# Patient Record
Sex: Female | Born: 1995 | Race: Black or African American | Hispanic: No | Marital: Single | Smoking: Never smoker
Health system: Southern US, Community
[De-identification: ages and names within clinical notes are randomized; demographics above are authoritative.]

## PROBLEM LIST (undated history)

## (undated) ENCOUNTER — Inpatient Hospital Stay (HOSPITAL_COMMUNITY): Payer: Self-pay

## (undated) DIAGNOSIS — F419 Anxiety disorder, unspecified: Secondary | ICD-10-CM

## (undated) DIAGNOSIS — Z789 Other specified health status: Secondary | ICD-10-CM

## (undated) HISTORY — PX: NO PAST SURGERIES: SHX2092

---
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
## 2013-03-20 ENCOUNTER — Other Ambulatory Visit: Payer: Self-pay | Admitting: Family Medicine

## 2013-04-01 ENCOUNTER — Ambulatory Visit
Admission: RE | Admit: 2013-04-01 | Discharge: 2013-04-01 | Disposition: A | Discharge status: Home / Self Care | Payer: Federal, State, Local not specified - PPO | Source: Ambulatory Visit | Attending: Family Medicine | Admitting: Family Medicine

## 2013-04-01 MED ORDER — GADOBENATE DIMEGLUMINE 529 MG/ML IV SOLN
10.0000 mL | Freq: Once | INTRAVENOUS | Status: AC | PRN
  Administered 2013-04-01: 10 mL via INTRAVENOUS

## 2013-10-21 ENCOUNTER — Emergency Department (HOSPITAL_COMMUNITY)
Admission: EM | Admit: 2013-10-21 | Discharge: 2013-10-21 | Disposition: A | Discharge status: Home / Self Care | Payer: Federal, State, Local not specified - PPO | Attending: Emergency Medicine | Admitting: Emergency Medicine

## 2013-10-21 ENCOUNTER — Encounter (HOSPITAL_COMMUNITY): Payer: Self-pay | Admitting: Emergency Medicine

## 2013-10-21 DIAGNOSIS — Z3202 Encounter for pregnancy test, result negative: Secondary | ICD-10-CM | POA: Insufficient documentation

## 2013-10-21 DIAGNOSIS — R109 Unspecified abdominal pain: Secondary | ICD-10-CM

## 2013-10-21 DIAGNOSIS — Z79899 Other long term (current) drug therapy: Secondary | ICD-10-CM | POA: Insufficient documentation

## 2013-10-21 DIAGNOSIS — R1013 Epigastric pain: Secondary | ICD-10-CM | POA: Insufficient documentation

## 2013-10-21 DIAGNOSIS — R112 Nausea with vomiting, unspecified: Secondary | ICD-10-CM | POA: Insufficient documentation

## 2013-10-21 DIAGNOSIS — R197 Diarrhea, unspecified: Secondary | ICD-10-CM | POA: Insufficient documentation

## 2013-10-21 LAB — CBC WITH DIFFERENTIAL/PLATELET
BASOS ABS: 0 10*3/uL (ref 0.0–0.1)
BASOS PCT: 0 % (ref 0–1)
EOS ABS: 0.2 10*3/uL (ref 0.0–0.7)
EOS PCT: 2 % (ref 0–5)
HEMATOCRIT: 36.5 % (ref 36.0–46.0)
HEMOGLOBIN: 12.4 g/dL (ref 12.0–15.0)
Lymphocytes Relative: 24 % (ref 12–46)
Lymphs Abs: 2 10*3/uL (ref 0.7–4.0)
MCH: 29.8 pg (ref 26.0–34.0)
MCHC: 34 g/dL (ref 30.0–36.0)
MCV: 87.7 fL (ref 78.0–100.0)
MONO ABS: 0.4 10*3/uL (ref 0.1–1.0)
Monocytes Relative: 5 % (ref 3–12)
NEUTROS PCT: 69 % (ref 43–77)
Neutro Abs: 5.7 10*3/uL (ref 1.7–7.7)
Platelets: 194 10*3/uL (ref 150–400)
RBC: 4.16 MIL/uL (ref 3.87–5.11)
RDW: 12.1 % (ref 11.5–15.5)
WBC: 8.3 10*3/uL (ref 4.0–10.5)

## 2013-10-21 LAB — URINALYSIS, ROUTINE W REFLEX MICROSCOPIC
BILIRUBIN URINE: NEGATIVE
GLUCOSE, UA: NEGATIVE mg/dL
HGB URINE DIPSTICK: NEGATIVE
KETONES UR: NEGATIVE mg/dL
LEUKOCYTES UA: NEGATIVE
Nitrite: NEGATIVE
Protein, ur: NEGATIVE mg/dL
Specific Gravity, Urine: 1.022 (ref 1.005–1.030)
UROBILINOGEN UA: 1 mg/dL (ref 0.0–1.0)
pH: 7 (ref 5.0–8.0)

## 2013-10-21 LAB — COMPREHENSIVE METABOLIC PANEL
ALBUMIN: 4.1 g/dL (ref 3.5–5.2)
ALK PHOS: 54 U/L (ref 39–117)
ALT: 11 U/L (ref 0–35)
AST: 17 U/L (ref 0–37)
BUN: 9 mg/dL (ref 6–23)
CALCIUM: 9.1 mg/dL (ref 8.4–10.5)
CHLORIDE: 106 meq/L (ref 96–112)
CO2: 24 mEq/L (ref 19–32)
Creatinine, Ser: 0.7 mg/dL (ref 0.50–1.10)
GLUCOSE: 103 mg/dL — AB (ref 70–99)
POTASSIUM: 3.9 meq/L (ref 3.7–5.3)
Sodium: 140 mEq/L (ref 137–147)
TOTAL PROTEIN: 7.1 g/dL (ref 6.0–8.3)
Total Bilirubin: 0.3 mg/dL (ref 0.3–1.2)

## 2013-10-21 LAB — LIPASE, BLOOD: Lipase: 23 U/L (ref 11–59)

## 2013-10-21 LAB — POC URINE PREG, ED: Preg Test, Ur: NEGATIVE

## 2013-10-21 MED ORDER — ONDANSETRON HCL 4 MG PO TABS: 4.0000 mg | ORAL_TABLET | Freq: Three times a day (TID) | ORAL | Status: DC | PRN

## 2013-10-21 MED ORDER — DICYCLOMINE HCL 10 MG PO CAPS
10.0000 mg | ORAL_CAPSULE | Freq: Once | ORAL | Status: AC
  Administered 2013-10-21: 10 mg via ORAL
  Filled 2013-10-21: qty 1

## 2013-10-21 NOTE — ED Notes (Signed)
Pt states she started having abd pain, vomiting and diarrhea this morning about 0230. Denies any urinary symptoms.

## 2013-10-21 NOTE — ED Notes (Signed)
Patient tolerated oral challenge without incident.  

## 2013-10-21 NOTE — Discharge Instructions (Signed)
Do not hesitate to return to the Emergency Department for any new, worsening or concerning symptoms.  ° °If you do not have a primary care doctor you can establish one at the  ° °[facility]: °[address] °[place] [address] °[phone] ° °After you establish care. Let them know you were seen in the emergency room. They must obtain records for further management.  ° °Abdominal Pain, Women °Abdominal (stomach, pelvic, or belly) pain can be caused by many things. It is important to tell your doctor: °· The location of the pain. °· Does it come and go or is it present all the time? °· Are there things that start the pain (eating certain foods, exercise)? °· Are there other symptoms associated with the pain (fever, nausea, vomiting, diarrhea)? °All of this is helpful to know when trying to find the cause of the pain. °CAUSES  °· Stomach: virus or bacteria infection, or ulcer. °· Intestine: appendicitis (inflamed appendix), regional ileitis (Crohn's disease), ulcerative colitis (inflamed colon), irritable bowel syndrome, diverticulitis (inflamed diverticulum of the colon), or cancer of the stomach or intestine. °· Gallbladder disease or stones in the gallbladder. °· Kidney disease, kidney stones, or infection. °· Pancreas infection or cancer. °· Fibromyalgia (pain disorder). °· Diseases of the female organs: °· Uterus: fibroid (non-cancerous) tumors or infection. °· Fallopian tubes: infection or tubal pregnancy. °· Ovary: cysts or tumors. °· Pelvic adhesions (scar tissue). °· Endometriosis (uterus lining tissue growing in the pelvis and on the pelvic organs). °· Pelvic congestion syndrome (female organs filling up with blood just before the menstrual period). °· Pain with the menstrual period. °· Pain with ovulation (producing an egg). °· Pain with an IUD (intrauterine device, birth control) in the uterus. °· Cancer of the female organs. °· Functional pain (pain not caused by a disease, may  improve without treatment). °· Psychological pain. °· Depression. °DIAGNOSIS  °Your doctor will decide the seriousness of your pain by doing an examination. °· Blood tests. °· X-rays. °· Ultrasound. °· CT scan (computed tomography, special type of X-ray). °· MRI (magnetic resonance imaging). °· Cultures, for infection. °· Barium enema (dye inserted in the large intestine, to better view it with X-rays). °· Colonoscopy (looking in intestine with a lighted tube). °· Laparoscopy (minor surgery, looking in abdomen with a lighted tube). °· Major abdominal exploratory surgery (looking in abdomen with a large incision). °TREATMENT  °The treatment will depend on the cause of the pain.  °· Many cases can be observed and treated at home. °· Over-the-counter medicines recommended by your caregiver. °· Prescription medicine. °· Antibiotics, for infection. °· Birth control pills, for painful periods or for ovulation pain. °· Hormone treatment, for endometriosis. °· Nerve blocking injections. °· Physical therapy. °· Antidepressants. °· Counseling with a psychologist or psychiatrist. °· Minor or major surgery. °HOME CARE INSTRUCTIONS  °· Do not take laxatives, unless directed by your caregiver. °· Take over-the-counter pain medicine only if ordered by your caregiver. Do not take aspirin because it can cause an upset stomach or bleeding. °· Try a clear liquid diet (broth or water) as ordered by your caregiver. Slowly move to a bland diet, as tolerated, if the pain is related to the stomach or intestine. °· Have a thermometer and take your temperature several times a day, and record it. °· Bed rest and sleep, if it helps the pain. °· Avoid sexual intercourse, if it causes pain. °· Avoid stressful situations. °· Keep your follow-up appointments and tests, as   as your caregiver orders.  If the pain does not go away with medicine or surgery, you may try:  Acupuncture.  Relaxation exercises (yoga, meditation).  Group  therapy.  Counseling. SEEK MEDICAL CARE IF:   You notice certain foods cause stomach pain.  Your home care treatment is not helping your pain.  You need stronger pain medicine.  You want your IUD removed.  You feel faint or lightheaded.  You develop nausea and vomiting.  You develop a rash.  You are having side effects or an allergy to your medicine. SEEK IMMEDIATE MEDICAL CARE IF:   Your pain does not go away or gets worse.  You have a fever.  Your pain is felt only in portions of the abdomen. The right side could possibly be appendicitis. The left lower portion of the abdomen could be colitis or diverticulitis.  You are passing blood in your stools (bright red or black tarry stools, with or without vomiting).  You have blood in your urine.  You develop chills, with or without a fever.  You pass out. MAKE SURE YOU:   Understand these instructions.  Will watch your condition.  Will get help right away if you are not doing well or get worse. Document Released: 03/13/2007 Document Revised: 08/08/2011 Document Reviewed: 04/02/2009 [facility] Patient Information 2014 [place], [place].

## 2013-10-21 NOTE — ED Provider Notes (Signed)
CSN: [id number]     Arrival date & time 10/21/13  0534 History   First MD Initiated Contact with Patient 10/21/13 0703     Chief Complaint  Patient presents with  . Abdominal Pain     (Consider location/radiation/quality/duration/timing/severity/associated sxs/prior Treatment) HPI  Kathleen Durham is a 18 y.o. female complaining acute onset of severe epigastric pain at 2:30 AM which woke her from sleep. Pain is 10 out of 10, but after the pain came she started vomiting and having diarrhea. This lasted for 3 hours and spontaneously resolved. Patient states that pain is minimal she is to sore right now. Patient denies sick contacts, excessive alcohol use, fever chills, abnormal vaginal discharge, dysuria, urinary frequency.   History reviewed. No pertinent past medical history. History reviewed. No pertinent past surgical history. No family history on file. History  Substance Use Topics  . Smoking status: Never Smoker   . Smokeless tobacco: Not on file  . Alcohol Use: No   OB History   Grav Para Term Preterm Abortions TAB SAB Ect Mult Living                 Review of Systems  10 systems reviewed and found to be negative, except as noted in the HPI.   Allergies  Review of patient's allergies indicates no known allergies.  Home Medications   Prior to Admission medications   Medication Sig Start Date End Date Taking? Authorizing Provider  citalopram (CELEXA) 10 MG tablet Take 10 mg by mouth daily.   Yes Historical Provider, MD   BP 101/55  Pulse 81  Temp(Src) 98.8 F (37.1 C) (Oral)  Resp 18  Ht 5\' 1"  (1.549 m)  Wt 100 lb (45.36 kg)  BMI 18.90 kg/m2  SpO2 100%  LMP 10/18/2013 Physical Exam  Nursing note and vitals reviewed. Constitutional: She is oriented to person, place, and time. She appears well-developed and well-nourished. No distress.  HENT:  Head: Normocephalic and atraumatic.  Mouth/Throat: Oropharynx is clear and moist.  Eyes: Conjunctivae and EOM are  normal. Pupils are equal, round, and reactive to light.  Neck: Normal range of motion.  Cardiovascular: Normal rate, regular rhythm and intact distal pulses.   Pulmonary/Chest: Effort normal and breath sounds normal. No stridor. No respiratory distress. She has no wheezes. She has no rales. She exhibits no tenderness.  Abdominal: Soft. Bowel sounds are normal. She exhibits no distension and no mass. There is no tenderness. There is no rebound and no guarding.  Musculoskeletal: Normal range of motion.  Neurological: She is alert and oriented to person, place, and time.  Skin: Skin is warm.  Psychiatric: She has a normal mood and affect.    ED Course  Procedures (including critical care time) Labs Review Labs Reviewed  COMPREHENSIVE METABOLIC PANEL - Abnormal; Notable for the following:    Glucose, Bld 103 (*)    All other components within normal limits  CBC WITH DIFFERENTIAL  LIPASE, BLOOD  URINALYSIS, ROUTINE W REFLEX MICROSCOPIC  POC URINE PREG, ED    Imaging Review No results found.   EKG Interpretation None      MDM   Final diagnoses:  Abdominal pain  Nausea vomiting and diarrhea    Filed Vitals:   10/21/13 0554 10/21/13 0725  BP: 110/72 101/55  Pulse: 83 81  Temp: 98.8 F (37.1 C)   TempSrc: Oral   Resp: 20 18  Height: 5\' 1"  (1.549 m)   Weight: 100 lb (45.36 kg)   SpO2:  99% 100%    Medications  dicyclomine (BENTYL) capsule 10 mg (10 mg Oral Given 10/21/13 0743)    Kathleen Durham is a 18 y.o. female presenting with severe epigastric pain which woke her from sleep at 2:30 AM. It is associated with multiple episodes of nonbloody, nonbilious, coffee-ground emesis and diarrhea. All symptoms are now currently resolved. Abdominal exam is completely benign. Blood work reassuring. Plan is to by mouth challenge and reassess. Serial abdominal exams remained benign, patient with no pain, nausea or persistent vomiting. She is passed by mouth challenge, is amenable to  discharge. A sensation of discussion of return precautions patient verbalized. Seems reliable for followup. I advised her that we'll write her for Zofran but if she does vomit again we need to see her back in the ED.  Evaluation does not show pathology that would require ongoing emergent intervention or inpatient treatment. Pt is hemodynamically stable and mentating appropriately. Discussed findings and plan with patient/guardian, who agrees with care plan. All questions answered. Return precautions discussed and outpatient follow up given.   New Prescriptions   ONDANSETRON (ZOFRAN) 4 MG TABLET    Take 1 tablet (4 mg total) by mouth every 8 (eight) hours as needed for nausea or vomiting.    Note: Portions of this report may have been transcribed using voice recognition software. Every effort was made to ensure accuracy; however, inadvertent computerized transcription errors may be present     Kathleen Emeryicole Lavaya Defreitas, PA-C 10/21/13 [phone]

## 2013-10-21 NOTE — ED Notes (Signed)
Pt states no abnormal bleeding but does have some discharge

## 2013-10-24 NOTE — ED Provider Notes (Signed)
Medical screening examination/treatment/procedure(s) were performed by non-physician practitioner and as supervising physician I was immediately available for consultation/collaboration.    Emmerson Shuffield L Orah Sonnen, MD 10/24/13 0709 

## 2014-02-12 ENCOUNTER — Emergency Department (HOSPITAL_COMMUNITY)
Admission: EM | Admit: 2014-02-12 | Discharge: 2014-02-12 | Disposition: A | Discharge status: Home / Self Care | Payer: Federal, State, Local not specified - PPO | Attending: Emergency Medicine | Admitting: Emergency Medicine

## 2014-02-12 ENCOUNTER — Encounter (HOSPITAL_COMMUNITY): Payer: Self-pay | Admitting: Emergency Medicine

## 2014-02-12 DIAGNOSIS — G44019 Episodic cluster headache, not intractable: Secondary | ICD-10-CM

## 2014-02-12 DIAGNOSIS — H5710 Ocular pain, unspecified eye: Secondary | ICD-10-CM | POA: Insufficient documentation

## 2014-02-12 DIAGNOSIS — H5789 Other specified disorders of eye and adnexa: Secondary | ICD-10-CM | POA: Insufficient documentation

## 2014-02-12 DIAGNOSIS — G44009 Cluster headache syndrome, unspecified, not intractable: Secondary | ICD-10-CM | POA: Diagnosis not present

## 2014-02-12 MED ORDER — TETRACAINE HCL 0.5 % OP SOLN
1.0000 [drp] | Freq: Once | OPHTHALMIC | Status: AC
  Administered 2014-02-12: 1 [drp] via OPHTHALMIC
  Filled 2014-02-12: qty 2

## 2014-02-12 MED ORDER — KETOROLAC TROMETHAMINE 60 MG/2ML IM SOLN
30.0000 mg | Freq: Once | INTRAMUSCULAR | Status: AC
  Administered 2014-02-12: 30 mg via INTRAMUSCULAR
  Filled 2014-02-12: qty 2

## 2014-02-12 MED ORDER — FLUORESCEIN SODIUM 1 MG OP STRP
1.0000 | ORAL_STRIP | Freq: Once | OPHTHALMIC | Status: AC
  Administered 2014-02-12: 2 via OPHTHALMIC
  Filled 2014-02-12: qty 1

## 2014-02-12 MED ORDER — ONDANSETRON 4 MG PO TBDP
4.0000 mg | ORAL_TABLET | Freq: Once | ORAL | Status: AC
  Administered 2014-02-12: 4 mg via ORAL
  Filled 2014-02-12: qty 1

## 2014-02-12 NOTE — Discharge Instructions (Signed)
Cluster Headache Cluster headaches are recognized by their pattern of deep, intense head pain. They normally occur on one side of your head, but they may "switch sides" in subsequent episodes. Typically, cluster headaches:   Are severe in nature.   Occur repeatedly over weeks to months and are followed by periods of no headaches.   Can last from 15 minutes to 3 hours.   Occur at the same time each day, often at night.   Occur several times a day. CAUSES The exact cause of cluster headaches is not known. Alcohol use may be associated with cluster headaches. SIGNS AND SYMPTOMS   Severe pain that begins in or around your eye or temple.   One-sided head pain.   Feeling sick to your stomach (nauseous).   Sensitivity to light.   Runny nose.   Eye redness, tearing, and nasal stuffiness on the side of your head where you are experiencing pain.   Sweaty, pale skin of the face.   Droopy or swollen eyelid.   Restlessness. DIAGNOSIS  Cluster headaches are diagnosed based on symptoms and a physical exam. Your health care provider may order a CT scan or an MRI of your head or lab tests to see if your headaches are caused by other medical conditions.  TREATMENT   Medicines for pain relief and to prevent recurrent attacks. Some people may need a combination of medicines.  Oxygen for pain relief.   Biofeedback programs to help reduce headache pain.  It may be helpful to keep a headache diary. This may help you find a trend for what is triggering your headaches. Your health care provider can develop a treatment plan.  HOME CARE INSTRUCTIONS  During cluster periods:   Follow a regular sleep schedule. Do not vary the amount and time that you sleep from day to day. It is important to stay on the same schedule during a cluster period to help prevent headaches.   Avoid alcohol.   Stop smoking if you smoke.  SEEK MEDICAL CARE IF:  You have any changes from your previous  cluster headaches either in intensity or frequency.   You are not getting relief from medicines you are taking.  SEEK IMMEDIATE MEDICAL CARE IF:   You faint.   You have weakness or numbness, especially on one side of your body or face.   You have double vision.   You have nausea or vomiting that is not relieved within several hours.   You cannot keep your balance or have difficulty talking or walking.   You have neck pain or stiffness.   You have a fever. MAKE SURE YOU:  Understand these instructions.   Will watch your condition.   Will get help right away if you are not doing well or get worse. Document Released: 05/16/2005 Document Revised: 03/06/2013 Document Reviewed: 12/06/2012 [employer] Patient Information 2015 [employer]. This information is not intended to replace advice given to you by your health care provider. Make sure you discuss any questions you have with your health care provider.  

## 2014-02-12 NOTE — ED Provider Notes (Addendum)
CSN: [id number]     Arrival date & time 02/12/14  0913 History   First MD Initiated Contact with Patient 02/12/14 0935     Chief Complaint  Patient presents with  . Nausea  . Emesis  . Eye Pain     (Consider location/radiation/quality/duration/timing/severity/associated sxs/prior Treatment) HPI  Patient presents with left eye pain and headache. Does report history of  Migraines which are usually temporal. Patient reports that she slept her contacts last night about her left eye pain was related to this. She denies any neck pain or fevers. She did not take anything for her pain this morning. Currently her pain is 9/10. She reports associated nausea and vomiting which she's had in the past with her headaches. Feels her vision is blurry in the left eye; however, patient has removed her left contact and states that she has "really bad vision to begin with." She denies any weakness, numbness, tingling.  Does report excessive tearing from the left eye.  Didn't take anything for the pain this morning.  History reviewed. No pertinent past medical history. History reviewed. No pertinent past surgical history. No family history on file. History  Substance Use Topics  . Smoking status: Never Smoker   . Smokeless tobacco: Not on file  . Alcohol Use: No   OB History   Grav Para Term Preterm Abortions TAB SAB Ect Mult Living                 Review of Systems  Constitutional: Negative for fever.  Eyes: Positive for photophobia, pain and visual disturbance. Negative for discharge, redness and itching.  Respiratory: Negative for chest tightness and shortness of breath.   Cardiovascular: Negative for chest pain.  Gastrointestinal: Positive for nausea and vomiting. Negative for abdominal pain and diarrhea.  Genitourinary: Negative for dysuria.  Musculoskeletal: Negative for neck pain.  Neurological: Positive for headaches. Negative for dizziness, weakness and light-headedness.   Psychiatric/Behavioral: Negative for confusion.  All other systems reviewed and are negative.     Allergies  Review of patient's allergies indicates no known allergies.  Home Medications   Prior to Admission medications   Not on File   BP 108/64  Pulse 77  Temp(Src) 98.6 F (37 C) (Oral)  Resp 16  Ht  (1.549 m)  Wt 107 lb 9 oz (48.79 kg)  BMI 20.33 kg/m2  SpO2 100%  LMP 02/05/2014 Physical Exam  Nursing note and vitals reviewed. Constitutional: She is oriented to person, place, and time. She appears well-developed and well-nourished. No distress.  HENT:  Head: Normocephalic and atraumatic.  Mouth/Throat: Oropharynx is clear and moist.  Eyes: Conjunctivae and EOM are normal. Pupils are equal, round, and reactive to light.  Pupils 5 mm reactive bilaterally, vision documented 20/70 right eye, 20/800 left eye, fluoroscein exam without evidence of corneal abrasion or corneal ulcer, increased tearing, pressure 1 mmHg Left eye/3 mmHg Right eye  Neck: Neck supple.  Cardiovascular: Normal rate, regular rhythm and normal heart sounds.   Pulmonary/Chest: Effort normal and breath sounds normal. No respiratory distress. She has no wheezes.  Neurological: She is alert and oriented to person, place, and time. No cranial nerve deficit.  5 out of 5 strength in all 4 extremities  Skin: Skin is warm and dry.  Psychiatric: She has a normal mood and affect.    ED Course  Procedures (including critical care time) Labs Review Labs Reviewed - No data to display  Imaging Review No results found.   EKG  Interpretation None      MDM   Final diagnoses:  Episodic cluster headache, not intractable    Patient presents with headache and left eye pain. She is nontoxic and nonfocal on exam. Eye exam is unremarkable showing no evidence of corneal abrasion or corneal ulcer. She does have decreased visual acuity be reports poor vision in that eye without correction. She has a history of  migraines; however, given excessive tearing in the eye, this may be more consistent with a cluster headache. Patient was given Toradol and Zofran.  Patient reports improvement of symptoms with treatment. Discussed the patient suspicion for cluster headache. Patient instructed to take ibuprofen at home as needed and was given education regarding cluster headaches. Patient also encouraged not to sleep in her contacts.  After history, exam, and medical workup I feel the patient has been appropriately medically screened and is safe for discharge home. Pertinent diagnoses were discussed with the patient. Patient was given return precautions.    Shon Baton, MD 02/12/14 1157  Shon Baton, MD 02/12/14 [phone]

## 2014-02-12 NOTE — ED Notes (Addendum)
Visual Acuity                      Right Eye 20/70 with a contact in.  Left Eye 20/800  Both 20/70

## 2014-02-12 NOTE — ED Notes (Signed)
Pt states L sided eye pain, onset this morning, also reports headache and nausea/vomiting x1 month. 8/10 pain at the time. Pt is alert and oriented x4. No neurological deficits noted.

## 2014-02-12 NOTE — ED Notes (Addendum)
Pt states she woke this morning with L eye pain.  Pt reports that the pain is so bad she has had N/V.  Pt also reports she has a migraine, but does not appear to have any problems texting on her phone.

## 2015-01-16 ENCOUNTER — Emergency Department (HOSPITAL_COMMUNITY)
Admission: EM | Admit: 2015-01-16 | Discharge: 2015-01-16 | Disposition: A | Discharge status: Home / Self Care | Payer: Federal, State, Local not specified - PPO | Attending: Emergency Medicine | Admitting: Emergency Medicine

## 2015-01-16 ENCOUNTER — Emergency Department (HOSPITAL_COMMUNITY): Payer: Federal, State, Local not specified - PPO

## 2015-01-16 ENCOUNTER — Encounter (HOSPITAL_COMMUNITY): Payer: Self-pay | Admitting: General Practice

## 2015-01-16 DIAGNOSIS — K5900 Constipation, unspecified: Secondary | ICD-10-CM | POA: Insufficient documentation

## 2015-01-16 DIAGNOSIS — N898 Other specified noninflammatory disorders of vagina: Secondary | ICD-10-CM

## 2015-01-16 DIAGNOSIS — R102 Pelvic and perineal pain: Secondary | ICD-10-CM | POA: Diagnosis not present

## 2015-01-16 DIAGNOSIS — R1030 Lower abdominal pain, unspecified: Secondary | ICD-10-CM | POA: Diagnosis present

## 2015-01-16 DIAGNOSIS — R110 Nausea: Secondary | ICD-10-CM | POA: Diagnosis not present

## 2015-01-16 DIAGNOSIS — O26899 Other specified pregnancy related conditions, unspecified trimester: Secondary | ICD-10-CM

## 2015-01-16 DIAGNOSIS — Z331 Pregnant state, incidental: Secondary | ICD-10-CM | POA: Insufficient documentation

## 2015-01-16 DIAGNOSIS — R109 Unspecified abdominal pain: Secondary | ICD-10-CM

## 2015-01-16 DIAGNOSIS — Z3490 Encounter for supervision of normal pregnancy, unspecified, unspecified trimester: Secondary | ICD-10-CM

## 2015-01-16 LAB — CBC WITH DIFFERENTIAL/PLATELET
BASOS ABS: 0 10*3/uL (ref 0.0–0.1)
BASOS PCT: 0 % (ref 0–1)
EOS ABS: 0.2 10*3/uL (ref 0.0–0.7)
EOS PCT: 2 % (ref 0–5)
HCT: 37.5 % (ref 36.0–46.0)
Hemoglobin: 12.9 g/dL (ref 12.0–15.0)
Lymphocytes Relative: 19 % (ref 12–46)
Lymphs Abs: 2.2 10*3/uL (ref 0.7–4.0)
MCH: 29.7 pg (ref 26.0–34.0)
MCHC: 34.4 g/dL (ref 30.0–36.0)
MCV: 86.2 fL (ref 78.0–100.0)
MONOS PCT: 5 % (ref 3–12)
Monocytes Absolute: 0.5 10*3/uL (ref 0.1–1.0)
Neutro Abs: 8.2 10*3/uL — ABNORMAL HIGH (ref 1.7–7.7)
Neutrophils Relative %: 74 % (ref 43–77)
PLATELETS: 223 10*3/uL (ref 150–400)
RBC: 4.35 MIL/uL (ref 3.87–5.11)
RDW: 11.7 % (ref 11.5–15.5)
WBC: 11.1 10*3/uL — ABNORMAL HIGH (ref 4.0–10.5)

## 2015-01-16 LAB — COMPREHENSIVE METABOLIC PANEL
ALT: 10 U/L — ABNORMAL LOW (ref 14–54)
AST: 21 U/L (ref 15–41)
Albumin: 4.1 g/dL (ref 3.5–5.0)
Alkaline Phosphatase: 52 U/L (ref 38–126)
Anion gap: 10 (ref 5–15)
BUN: <5 mg/dL — ABNORMAL LOW (ref 6–20)
CHLORIDE: 102 mmol/L (ref 101–111)
CO2: 25 mmol/L (ref 22–32)
Calcium: 9.2 mg/dL (ref 8.9–10.3)
Creatinine, Ser: 0.71 mg/dL (ref 0.44–1.00)
GFR calc Af Amer: >60 mL/min (ref >60)
GFR calc non Af Amer: >60 mL/min (ref >60)
GLUCOSE: 82 mg/dL (ref 65–99)
POTASSIUM: 3.9 mmol/L (ref 3.5–5.1)
Sodium: 137 mmol/L (ref 135–145)
Total Bilirubin: 0.8 mg/dL (ref 0.3–1.2)
Total Protein: 6.8 g/dL (ref 6.5–8.1)

## 2015-01-16 LAB — URINE MICROSCOPIC-ADD ON

## 2015-01-16 LAB — WET PREP, GENITAL
TRICH WET PREP: NONE SEEN
Yeast Wet Prep HPF POC: NONE SEEN

## 2015-01-16 LAB — URINALYSIS, ROUTINE W REFLEX MICROSCOPIC
Bilirubin Urine: NEGATIVE
GLUCOSE, UA: NEGATIVE mg/dL
HGB URINE DIPSTICK: NEGATIVE
Nitrite: NEGATIVE
Protein, ur: NEGATIVE mg/dL
Specific Gravity, Urine: 1.029 (ref 1.005–1.030)
Urobilinogen, UA: 1 mg/dL (ref 0.0–1.0)
pH: 7 (ref 5.0–8.0)

## 2015-01-16 LAB — POC URINE PREG, ED: PREG TEST UR: POSITIVE — AB

## 2015-01-16 LAB — LIPASE, BLOOD: Lipase: 15 U/L — ABNORMAL LOW (ref 22–51)

## 2015-01-16 LAB — ABO/RH: ABO/RH(D): O POS

## 2015-01-16 LAB — SEDIMENTATION RATE: Sed Rate: 4 mm/hr (ref 0–22)

## 2015-01-16 LAB — HCG, QUANTITATIVE, PREGNANCY: HCG, BETA CHAIN, QUANT, S: 26875 m[IU]/mL — AB (ref <5)

## 2015-01-16 MED ORDER — SODIUM CHLORIDE 0.9 % IV BOLUS (SEPSIS)
1000.0000 mL | Freq: Once | INTRAVENOUS | Status: AC
  Administered 2015-01-16: 1000 mL via INTRAVENOUS

## 2015-01-16 MED ORDER — PRENATAL COMPLETE 14-0.4 MG PO TABS: 2.0000 | ORAL_TABLET | Freq: Every day | ORAL | Status: DC

## 2015-01-16 NOTE — ED Provider Notes (Signed)
CSN: [id number]     Arrival date & time 01/16/15  1014 History   First MD Initiated Contact with Patient 01/16/15 1023     Chief Complaint  Patient presents with  . Abdominal Pain     (Consider location/radiation/quality/duration/timing/severity/associated sxs/prior Treatment) Patient is a 19 y.o. female presenting with abdominal pain. The history is provided by the patient and medical records. No language interpreter was used.  Abdominal Pain Associated symptoms: nausea and vaginal discharge   Associated symptoms: no chest pain, no constipation, no cough, no diarrhea, no dysuria, no fatigue, no fever, no hematuria, no shortness of breath and no vomiting      Kathleen Durham is a 19 y.o. female  with no major medical hx presents to the Emergency Department complaining of gradual, intermittent, progressively worsening lower abd pain onset 1.5 weeks ago.  Pt describes the pain as cramping rated at an 8/10.  She reports it lasts 30sec-1 min and happens several times per day.  Nothing makes it better.  Walking for a long time makes it worse. Associated symptoms include nausea without vomiting. Pt denies fever, chills, headache, neck pain, chest pain, SOB, vomiting, diarrhea, weakness, syncope, dysuria.  Pt reports she is some constipated. She reports hard stool and straining with small movements.  She reports last BM this AM without melena or hematochezia.  Pt reports vaginal discharge that is increased in amount and began to be malodorous in the last 24 hours.  Pt reports she is sexually active with 1 female partner.  Pt denies Hx of STD.  LMP: 12/10/2014 .  Pt is not on birth control.  Pt reports social EtOH usage, denies drug usage. Pt reports she has not been smoking for several months.     History reviewed. No pertinent past medical history. History reviewed. No pertinent past surgical history. No family history on file. Social History  Substance Use Topics  . Smoking status: Never Smoker   .  Smokeless tobacco: None  . Alcohol Use: No   OB History    No data available     Review of Systems  Constitutional: Negative for fever, diaphoresis, appetite change, fatigue and unexpected weight change.  HENT: Negative for mouth sores.   Eyes: Negative for visual disturbance.  Respiratory: Negative for cough, chest tightness, shortness of breath and wheezing.   Cardiovascular: Negative for chest pain.  Gastrointestinal: Positive for nausea and abdominal pain. Negative for vomiting, diarrhea and constipation.  Endocrine: Negative for polydipsia, polyphagia and polyuria.  Genitourinary: Positive for vaginal discharge. Negative for dysuria, urgency, frequency and hematuria.  Musculoskeletal: Negative for back pain and neck stiffness.  Skin: Negative for rash.  Allergic/Immunologic: Negative for immunocompromised state.  Neurological: Negative for syncope, light-headedness and headaches.  Hematological: Does not bruise/bleed easily.  Psychiatric/Behavioral: Negative for sleep disturbance. The patient is not nervous/anxious.       Allergies  Review of patient's allergies indicates no known allergies.  Home Medications   Prior to Admission medications   Medication Sig Start Date End Date Taking? Authorizing Provider  Prenatal Vit-Fe Fumarate-FA (PRENATAL COMPLETE) 14-0.4 MG TABS Take 2 tablets by mouth daily. 01/16/15   Jaaziah Schulke, PA-C   BP 99/64 mmHg  Pulse 82  SpO2 100%  LMP 12/13/2014 (Exact Date) Physical Exam  Constitutional: She appears well-developed and well-nourished. No distress.  Awake, alert, nontoxic appearance  HENT:  Head: Normocephalic and atraumatic.  Mouth/Throat: Oropharynx is clear and moist. No oropharyngeal exudate.  Eyes: Conjunctivae are normal. No scleral  icterus.  Neck: Normal range of motion. Neck supple.  Cardiovascular: Normal rate, regular rhythm, normal heart sounds and intact distal pulses.   No murmur heard. Pulmonary/Chest: Effort  normal and breath sounds normal. No respiratory distress. She has no wheezes.  Equal chest expansion  Abdominal: Soft. Bowel sounds are normal. She exhibits no distension and no mass. There is no tenderness. There is no rebound and no guarding. Hernia confirmed negative in the right inguinal area and confirmed negative in the left inguinal area.  Genitourinary: Uterus normal. No labial fusion. There is no rash, tenderness or lesion on the right labia. There is no rash, tenderness or lesion on the left labia. Uterus is not deviated, not enlarged, not fixed and not tender. Cervix exhibits no motion tenderness, no discharge and no friability. Right adnexum displays tenderness. Right adnexum displays no mass and no fullness. Left adnexum displays no mass, no tenderness and no fullness. No erythema, tenderness or bleeding in the vagina. No foreign body around the vagina. No signs of injury around the vagina. Vaginal discharge (Leukorrhea) found.  Musculoskeletal: Normal range of motion. She exhibits no edema.  Lymphadenopathy:       Right: No inguinal adenopathy present.       Left: No inguinal adenopathy present.  Neurological: She is alert.  Speech is clear and goal oriented Moves extremities without ataxia  Skin: Skin is warm and dry. She is not diaphoretic. No erythema.  Psychiatric: She has a normal mood and affect.  Nursing note and vitals reviewed.   ED Course  Procedures (including critical care time) Labs Review Labs Reviewed  WET PREP, GENITAL - Abnormal; Notable for the following:    Clue Cells Wet Prep HPF POC FEW (*)    WBC, Wet Prep HPF POC TOO NUMEROUS TO COUNT (*)    All other components within normal limits  URINALYSIS, ROUTINE W REFLEX MICROSCOPIC (NOT AT [facility]) - Abnormal; Notable for the following:    Color, Urine AMBER (*)    APPearance CLOUDY (*)    Ketones, ur >80 (*)    Leukocytes, UA SMALL (*)    All other components within normal limits  CBC WITH  DIFFERENTIAL/PLATELET - Abnormal; Notable for the following:    WBC 11.1 (*)    Neutro Abs 8.2 (*)    All other components within normal limits  COMPREHENSIVE METABOLIC PANEL - Abnormal; Notable for the following:    BUN <5 (*)    ALT 10 (*)    All other components within normal limits  LIPASE, BLOOD - Abnormal; Notable for the following:    Lipase 15 (*)    All other components within normal limits  HCG, QUANTITATIVE, PREGNANCY - Abnormal; Notable for the following:    hCG, Beta Chain, Kathleen Durham, S [id number] (*)    All other components within normal limits  URINE MICROSCOPIC-ADD ON - Abnormal; Notable for the following:    Squamous Epithelial / LPF FEW (*)    Bacteria, UA FEW (*)    All other components within normal limits  POC URINE PREG, ED - Abnormal; Notable for the following:    Preg Test, Ur POSITIVE (*)    All other components within normal limits  SEDIMENTATION RATE  RPR  HIV ANTIBODY (ROUTINE TESTING)  ABO/RH  GC/CHLAMYDIA PROBE AMP ([place]) NOT AT [facility]    Imaging Review US Ob Comp Less 14 Wks  01/16/2015   CLINICAL DATA:  Lower abdominal pain. Gestational age by LMP of 5 weeks 4  days.  EXAM: OBSTETRIC <14 WK [place] AND TRANSVAGINAL OB US  TECHNIQUE: Both transabdominal and transvaginal ultrasound examinations were performed for complete evaluation of the gestation as well as the maternal uterus, adnexal regions, and pelvic cul-de-sac. Transvaginal technique was performed to assess early pregnancy.  COMPARISON:  None.  FINDINGS: Intrauterine gestational sac: Visualized/normal in shape.  Yolk sac:  Visualized  Embryo:  Visualized  Cardiac Activity: Visualized  Heart Rate: 109  bpm  CRL:  4  mm   6 w   1 d                  [place] EDC: 09/10/2015  Maternal uterus/adnexae: Retroverted uterus. Both ovaries are normal in appearance. No adnexal mass visualized. Tiny amount of simple free fluid in cul-de-sac.  IMPRESSION: Single living IUP measuring 6 weeks 1 day, which is concordant with LMP.  No adnexal mass identified.   Electronically Signed   By: Myles Rosenthal M.D.   On: 01/16/2015 13:28   US Ob Transvaginal  01/16/2015   CLINICAL DATA:  Lower abdominal pain. Gestational age by LMP of 5 weeks 4 days.  EXAM: OBSTETRIC <14 WK [place] AND TRANSVAGINAL OB US  TECHNIQUE: Both transabdominal and transvaginal ultrasound examinations were performed for complete evaluation of the gestation as well as the maternal uterus, adnexal regions, and pelvic cul-de-sac. Transvaginal technique was performed to assess early pregnancy.  COMPARISON:  None.  FINDINGS: Intrauterine gestational sac: Visualized/normal in shape.  Yolk sac:  Visualized  Embryo:  Visualized  Cardiac Activity: Visualized  Heart Rate: 109  bpm  CRL:  4  mm   6 w   1 d                  [place] EDC: 09/10/2015  Maternal uterus/adnexae: Retroverted uterus. Both ovaries are normal in appearance. No adnexal mass visualized. Tiny amount of simple free fluid in cul-de-sac.  IMPRESSION: Single living IUP measuring 6 weeks 1 day, which is concordant with LMP. No adnexal mass identified.   Electronically Signed   By: Myles Rosenthal M.D.   On: 01/16/2015 13:28   I have personally reviewed and evaluated these images and lab results as part of my medical decision-making.   EKG Interpretation None      MDM   Final diagnoses:  Abdominal pain  Pelvic pain during pregnancy  Pregnancy  Vaginal discharge   Kathleen Durham presents with intermittent sharp, lower abd pain for the last 1.5 weeks.  Leukorrhea on exam.  No CMT.  Mild R adnexal tenderness without mass.  Pt is resting comfortably without abd pain at this time.    12:06 PM Pregnancy test positive.  Will obtain HGC quant and [place].  Mild leukocytosis and TNTC WBCs on wet prep.  UA without evidence of UTI.  Pt is receiving her fluids at this time.    Orthostatic VS for the past 24 hrs:  BP- Lying Pulse- Lying BP- Sitting Pulse- Sitting BP- Standing at 0 minutes Pulse- Standing at 0 minutes  01/16/15 1212  109/60 mmHg 79 115/63 mmHg 88 101/63 mmHg 85    1:45PM Patient without orthostatic hypotension. HCG Quant greater than 26,000.  Ultrasound with IUP at approximately 6 weeks.  Patient's cramping likely due to lack of water intake and pregnancy. Doubt heterotopic pregnancy.  Abdomen soft and nontender. Discussed strict return precautions for the patient including vaginal bleeding, fevers or abdominal pain that is not intermittent. Recommend close follow-up with OB/GYN. Patient discharged home with  prenatal vitamins.  BP 99/64 mmHg  Pulse 82  SpO2 100%  LMP 12/13/2014 (Exact Date)   Dierdre Forth, PA-C 01/16/15 1416  Raeford Razor, MD 01/21/15 [phone]

## 2015-01-16 NOTE — ED Notes (Addendum)
Pt complaining of abdominal pain that started a week and a half ago. Last night pt started feeling nauseated. Pt rating pain a 8/10. Pt describes the pain as a cramping that is intermittent. Pt declines any chills. Pt denies vomiting.

## 2015-01-16 NOTE — Discharge Instructions (Signed)
1. Medications: prenatal vitamins, usual home medications 2. Treatment: rest, drink plenty of fluids,  3. Follow Up: Please followup with OB/GYN for discussion of your diagnoses and further evaluation after today's visit; if you do not have a primary care doctor use the resource guide provided to find one; Please return to the ER for worsening symptoms, vaginal bleeding or other concerns    Abdominal Pain During Pregnancy Abdominal pain is common in pregnancy. Most of the time, it does not cause harm. There are many causes of abdominal pain. Some causes are more serious than others. Some of the causes of abdominal pain in pregnancy are easily diagnosed. Occasionally, the diagnosis takes time to understand. Other times, the cause is not determined. Abdominal pain can be a sign that something is very wrong with the pregnancy, or the pain may have nothing to do with the pregnancy at all. For this reason, always tell your health care provider if you have any abdominal discomfort. HOME CARE INSTRUCTIONS  Monitor your abdominal pain for any changes. The following actions may help to alleviate any discomfort you are experiencing:  Do not have sexual intercourse or put anything in your vagina until your symptoms go away completely.  Get plenty of rest until your pain improves.  Drink clear fluids if you feel nauseous. Avoid solid food as long as you are uncomfortable or nauseous.  Only take over-the-counter or prescription medicine as directed by your health care provider.  Keep all follow-up appointments with your health care provider. SEEK IMMEDIATE MEDICAL CARE IF:  You are bleeding, leaking fluid, or passing tissue from the vagina.  You have increasing pain or cramping.  You have persistent vomiting.  You have painful or bloody urination.  You have a fever.  You notice a decrease in your baby's movements.  You have extreme weakness or feel faint.  You have shortness of breath, with or  without abdominal pain.  You develop a severe headache with abdominal pain.  You have abnormal vaginal discharge with abdominal pain.  You have persistent diarrhea.  You have abdominal pain that continues even after rest, or gets worse. MAKE SURE YOU:   Understand these instructions.  Will watch your condition.  Will get help right away if you are not doing well or get worse. Document Released: 05/16/2005 Document Revised: 03/06/2013 Document Reviewed: 12/13/2012 [facility] Patient Information 2015 [place], [place]. This information is not intended to replace advice given to you by your health care provider. Make sure you discuss any questions you have with your health care provider.

## 2015-01-17 LAB — RPR: RPR Ser Ql: NONREACTIVE

## 2015-01-17 LAB — HIV ANTIBODY (ROUTINE TESTING W REFLEX): HIV SCREEN 4TH GENERATION: NONREACTIVE

## 2015-01-19 LAB — GC/CHLAMYDIA PROBE AMP (~~LOC~~) NOT AT ARMC
CHLAMYDIA, DNA PROBE: NEGATIVE
NEISSERIA GONORRHEA: NEGATIVE

## 2015-01-20 ENCOUNTER — Encounter (HOSPITAL_COMMUNITY): Payer: Self-pay | Admitting: Emergency Medicine

## 2015-01-20 ENCOUNTER — Emergency Department (HOSPITAL_COMMUNITY)
Admission: EM | Admit: 2015-01-20 | Discharge: 2015-01-20 | Disposition: A | Discharge status: Home / Self Care | Payer: Federal, State, Local not specified - PPO | Attending: Emergency Medicine | Admitting: Emergency Medicine

## 2015-01-20 DIAGNOSIS — Z3A01 Less than 8 weeks gestation of pregnancy: Secondary | ICD-10-CM | POA: Diagnosis not present

## 2015-01-20 DIAGNOSIS — R1030 Lower abdominal pain, unspecified: Secondary | ICD-10-CM | POA: Diagnosis not present

## 2015-01-20 DIAGNOSIS — O23591 Infection of other part of genital tract in pregnancy, first trimester: Secondary | ICD-10-CM | POA: Insufficient documentation

## 2015-01-20 DIAGNOSIS — O210 Mild hyperemesis gravidarum: Secondary | ICD-10-CM | POA: Diagnosis not present

## 2015-01-20 DIAGNOSIS — O9989 Other specified diseases and conditions complicating pregnancy, childbirth and the puerperium: Secondary | ICD-10-CM | POA: Diagnosis present

## 2015-01-20 DIAGNOSIS — Z79899 Other long term (current) drug therapy: Secondary | ICD-10-CM | POA: Diagnosis not present

## 2015-01-20 DIAGNOSIS — B9689 Other specified bacterial agents as the cause of diseases classified elsewhere: Secondary | ICD-10-CM

## 2015-01-20 DIAGNOSIS — O2341 Unspecified infection of urinary tract in pregnancy, first trimester: Secondary | ICD-10-CM | POA: Diagnosis not present

## 2015-01-20 DIAGNOSIS — N760 Acute vaginitis: Secondary | ICD-10-CM

## 2015-01-20 DIAGNOSIS — Z3490 Encounter for supervision of normal pregnancy, unspecified, unspecified trimester: Secondary | ICD-10-CM

## 2015-01-20 LAB — URINE MICROSCOPIC-ADD ON

## 2015-01-20 LAB — URINALYSIS, ROUTINE W REFLEX MICROSCOPIC
Bilirubin Urine: NEGATIVE
Glucose, UA: NEGATIVE mg/dL
Hgb urine dipstick: NEGATIVE
NITRITE: NEGATIVE
PROTEIN: 30 mg/dL — AB
Specific Gravity, Urine: 1.025 (ref 1.005–1.030)
Urobilinogen, UA: 1 mg/dL (ref 0.0–1.0)
pH: 6.5 (ref 5.0–8.0)

## 2015-01-20 LAB — WET PREP, GENITAL
Trich, Wet Prep: NONE SEEN
Yeast Wet Prep HPF POC: NONE SEEN

## 2015-01-20 MED ORDER — METRONIDAZOLE 500 MG PO TABS: 500.0000 mg | ORAL_TABLET | Freq: Two times a day (BID) | ORAL | Status: DC

## 2015-01-20 MED ORDER — ONDANSETRON 4 MG PO TBDP
4.0000 mg | ORAL_TABLET | Freq: Once | ORAL | Status: AC
  Administered 2015-01-20: 4 mg via ORAL

## 2015-01-20 MED ORDER — ONDANSETRON 4 MG PO TBDP
ORAL_TABLET | ORAL | Status: AC
  Filled 2015-01-20: qty 1

## 2015-01-20 MED ORDER — ACETAMINOPHEN 325 MG PO TABS
325.0000 mg | ORAL_TABLET | Freq: Once | ORAL | Status: AC
  Administered 2015-01-20: 325 mg via ORAL
  Filled 2015-01-20: qty 1

## 2015-01-20 MED ORDER — NITROFURANTOIN MONOHYD MACRO 100 MG PO CAPS: 100.0000 mg | ORAL_CAPSULE | Freq: Two times a day (BID) | ORAL | Status: DC

## 2015-01-20 NOTE — ED Notes (Signed)
Pt here from home with c/o nausea and abd pain  Pt does have some discharge and burning upon urination , no bleeding noted

## 2015-01-20 NOTE — ED Notes (Signed)
PA at the bedside.

## 2015-01-20 NOTE — Discharge Instructions (Signed)
Bacterial Vaginosis Bacterial vaginosis is a vaginal infection that occurs when the normal balance of bacteria in the vagina is disrupted. It results from an overgrowth of certain bacteria. This is the most common vaginal infection in women of childbearing age. Treatment is important to prevent complications, especially in pregnant women, as it can cause a premature delivery. CAUSES  Bacterial vaginosis is caused by an increase in harmful bacteria that are normally present in smaller amounts in the vagina. Several different kinds of bacteria can cause bacterial vaginosis. However, the reason that the condition develops is not fully understood. RISK FACTORS Certain activities or behaviors can put you at an increased risk of developing bacterial vaginosis, including:  Having a new sex partner or multiple sex partners.  Douching.  Using an intrauterine device (IUD) for contraception. Women do not get bacterial vaginosis from toilet seats, bedding, swimming pools, or contact with objects around them. SIGNS AND SYMPTOMS  Some women with bacterial vaginosis have no signs or symptoms. Common symptoms include:  Grey vaginal discharge.  A fishlike odor with discharge, especially after sexual intercourse.  Itching or burning of the vagina and vulva.  Burning or pain with urination. DIAGNOSIS  Your health care provider will take a medical history and examine the vagina for signs of bacterial vaginosis. A sample of vaginal fluid may be taken. Your health care provider will look at this sample under a microscope to check for bacteria and abnormal cells. A vaginal pH test may also be done.  TREATMENT  Bacterial vaginosis may be treated with antibiotic medicines. These may be given in the form of a pill or a vaginal cream. A second round of antibiotics may be prescribed if the condition comes back after treatment.  HOME CARE INSTRUCTIONS   Only take over-the-counter or prescription medicines as  directed by your health care provider.  If antibiotic medicine was prescribed, take it as directed. Make sure you finish it even if you start to feel better.  Do not have sex until treatment is completed.  Tell all sexual partners that you have a vaginal infection. They should see their health care provider and be treated if they have problems, such as a mild rash or itching.  Practice safe sex by using condoms and only having one sex partner. SEEK MEDICAL CARE IF:   Your symptoms are not improving after 3 days of treatment.  You have increased discharge or pain.  You have a fever. MAKE SURE YOU:   Understand these instructions.  Will watch your condition.  Will get help right away if you are not doing well or get worse. FOR MORE INFORMATION  Centers for Disease Control and Prevention, Division of STD Prevention: SolutionApps.[employer].za American Sexual Health Association (ASHA): www.ashastd.org  Document Released: 05/16/2005 Document Revised: 03/06/2013 Document Reviewed: 12/26/2012 [facility] Patient Information 2015 [place], [place]. This information is not intended to replace advice given to you by your health care provider. Make sure you discuss any questions you have with your health care provider.  First Trimester of Pregnancy The first trimester of pregnancy is from week 1 until the end of week 12 (months 1 through 3). During this time, your baby will begin to develop inside you. At 6-8 weeks, the eyes and face are formed, and the heartbeat can be seen on ultrasound. At the end of 12 weeks, all the baby's organs are formed. Prenatal care is all the medical care you receive before the birth of your baby. Make sure you get good prenatal  care and follow all of your doctor's instructions. HOME CARE  Medicines  Take medicine only as told by your doctor. Some medicines are safe and some are not during pregnancy.  Take your prenatal vitamins as told by your doctor.  Take medicine that  helps you poop (stool softener) as needed if your doctor says it is okay. Diet  Eat regular, healthy meals.  Your doctor will tell you the amount of weight gain that is right for you.  Avoid raw meat and uncooked cheese.  If you feel sick to your stomach (nauseous) or throw up (vomit):  Eat 4 or 5 small meals a day instead of 3 large meals.  Try eating a few soda crackers.  Drink liquids between meals instead of during meals.  If you have a hard time pooping (constipation):  Eat high-fiber foods like fresh vegetables, fruit, and whole grains.  Drink enough fluids to keep your pee (urine) clear or pale yellow. Activity and Exercise  Exercise only as told by your doctor. Stop exercising if you have cramps or pain in your lower belly (abdomen) or low back.  Try to avoid standing for long periods of time. Move your legs often if you must stand in one place for a long time.  Avoid heavy lifting.  Wear low-heeled shoes. Sit and stand up straight.  You can have sex unless your doctor tells you not to. Relief of Pain or Discomfort  Wear a good support bra if your breasts are sore.  Take warm water baths (sitz baths) to soothe pain or discomfort caused by hemorrhoids. Use hemorrhoid cream if your doctor says it is okay.  Rest with your legs raised if you have leg cramps or low back pain.  Wear support hose if you have puffy, bulging veins (varicose veins) in your legs. Raise (elevate) your feet for 15 minutes, 3-4 times a day. Limit salt in your diet. Prenatal Care  Schedule your prenatal visits by the twelfth week of pregnancy.  Write down your questions. Take them to your prenatal visits.  Keep all your prenatal visits as told by your doctor. Safety  Wear your seat belt at all times when driving.  Make a list of emergency phone numbers. The list should include numbers for family, friends, the hospital, and police and fire departments. General Tips  Ask your doctor for  a referral to a local prenatal class. Begin classes no later than at the start of month 6 of your pregnancy.  Ask for help if you need counseling or help with nutrition. Your doctor can give you advice or tell you where to go for help.  Do not use hot tubs, steam rooms, or saunas.  Do not douche or use tampons or scented sanitary pads.  Do not cross your legs for long periods of time.  Avoid litter boxes and soil used by cats.  Avoid all smoking, herbs, and alcohol. Avoid drugs not approved by your doctor.  Visit your dentist. At home, brush your teeth with a soft toothbrush. Be gentle when you floss. GET HELP IF:  You are dizzy.  You have mild cramps or pressure in your lower belly.  You have a nagging pain in your belly area.  You continue to feel sick to your stomach, throw up, or have watery poop (diarrhea).  You have a bad smelling fluid coming from your vagina.  You have pain with peeing (urination).  You have increased puffiness (swelling) in your face, hands, legs, or  ankles. GET HELP RIGHT AWAY IF:   You have a fever.  You are leaking fluid from your vagina.  You have spotting or bleeding from your vagina.  You have very bad belly cramping or pain.  You gain or lose weight rapidly.  You throw up blood. It may look like coffee grounds.  You are around people who have [place] measles, fifth disease, or chickenpox.  You have a very bad headache.  You have shortness of breath.  You have any kind of trauma, such as from a fall or a car accident. Document Released: 11/02/2007 Document Revised: 09/30/2013 Document Reviewed: 03/26/2013 [facility] Patient Information 2015 [place], [place]. This information is not intended to replace advice given to you by your health care provider. Make sure you discuss any questions you have with your health care provider.  Urinary Tract Infection A urinary tract infection (UTI) can occur any place along the urinary tract. The  tract includes the kidneys, ureters, bladder, and urethra. A type of germ called bacteria often causes a UTI. UTIs are often helped with antibiotic medicine.  HOME CARE   If given, take antibiotics as told by your doctor. Finish them even if you start to feel better.  Drink enough fluids to keep your pee (urine) clear or pale yellow.  Avoid tea, drinks with caffeine, and bubbly (carbonated) drinks.  Pee often. Avoid holding your pee in for a long time.  Pee before and after having sex (intercourse).  Wipe from front to back after you poop (bowel movement) if you are a woman. Use each tissue only once. GET HELP RIGHT AWAY IF:   You have back pain.  You have lower belly (abdominal) pain.  You have chills.  You feel sick to your stomach (nauseous).  You throw up (vomit).  Your burning or discomfort with peeing does not go away.  You have a fever.  Your symptoms are not better in 3 days. MAKE SURE YOU:   Understand these instructions.  Will watch your condition.  Will get help right away if you are not doing well or get worse. Document Released: 11/02/2007 Document Revised: 02/08/2012 Document Reviewed: 12/15/2011 [facility] Patient Information 2015 [place], [place]. This information is not intended to replace advice given to you by your health care provider. Make sure you discuss any questions you have with your health care provider.   Very important to follow up with OBGYN. Do NOT take ibuprofen for pain. Ginger may help with nausea. Avoid caffeine, alcohol, tobacco.

## 2015-01-20 NOTE — ED Notes (Signed)
Patient presents to ed c/o lower abd. Pain states she was here on Fri. And dx. With preg. C/o lower abd. Pain denies bleeding states she was told to come back if the pain continued.

## 2015-01-20 NOTE — ED Provider Notes (Signed)
CSN: [id number]     Arrival date & time 01/20/15  0908 History   First MD Initiated Contact with Patient 01/20/15 1023     Chief Complaint  Patient presents with  . Abdominal Pain  . Morning Sickness     (Consider location/radiation/quality/duration/timing/severity/associated sxs/prior Treatment) HPI Comments: Kathleen Durham is a 19 y.o F with no significant medical history that presents to the ED c/o worsening lower abdominal pain, dysuria, and dyspareunia. Pt states she came to ED last Friday c/o abdominal pain and was told that she was [redacted] weeks pregnant. Pt had IUP as resulted by transvaginal US. Pt was negative for chlamydia and gonorrhea. Pt states that yesterday morning while urinating she felt extreme burning and pain in her vagina and lower abdomen. She also states that she felt burning during intercourse and for 40 minutes post-coital. Pt also endorses nausea, vomiting and anorexia for 2 weeks. Pt states that she "can't keep anything down". Denies fevers, chills, headaches, neck pain, syncope, CP, SOB, weakness, new sexual partners. Pt is not on birth control, does not use protection against STDs. Pt reports that she is sexually active with one female partner. Denies diarrhea, constipation. Pt reports thick, white vaginal discharge, occasionally malodorous. Denies hx of STD. LMP: 12/10/2014. Denies tobacco, alcohol, and drug usage.   Patient is a 20 y.o. female presenting with abdominal pain. The history is provided by the patient.  Abdominal Pain Associated symptoms: dysuria, nausea, vaginal discharge and vomiting   Associated symptoms: no chest pain, no chills, no constipation, no cough, no diarrhea, no fatigue, no fever, no hematuria, no shortness of breath and no vaginal bleeding     History reviewed. No pertinent past medical history. History reviewed. No pertinent past surgical history. History reviewed. No pertinent family history. Social History  Substance Use Topics  . Smoking  status: Never Smoker   . Smokeless tobacco: None  . Alcohol Use: No   OB History    No data available     Review of Systems  Constitutional: Negative for fever, chills, diaphoresis and fatigue.  HENT: Negative for congestion.   Eyes: Negative for visual disturbance.  Respiratory: Negative for cough, chest tightness, shortness of breath and wheezing.   Cardiovascular: Negative for chest pain, palpitations and leg swelling.  Gastrointestinal: Positive for nausea, vomiting and abdominal pain. Negative for diarrhea, constipation and abdominal distention.  Genitourinary: Positive for dysuria, urgency, vaginal discharge, vaginal pain, pelvic pain and dyspareunia. Negative for hematuria, flank pain, vaginal bleeding, genital sores and menstrual problem.  Musculoskeletal: Negative for back pain, gait problem, neck pain and neck stiffness.  Skin: Negative for color change, pallor, rash and wound.  Neurological: Negative for dizziness, syncope, weakness, light-headedness and headaches.  Psychiatric/Behavioral: Negative.   All other systems reviewed and are negative.     Allergies  Review of patient's allergies indicates no known allergies.  Home Medications   Prior to Admission medications   Medication Sig Start Date End Date Taking? Authorizing Provider  Prenatal Vit-Fe Fumarate-FA (PRENATAL COMPLETE) 14-0.4 MG TABS Take 2 tablets by mouth daily. 01/16/15   Hannah Muthersbaugh, PA-C   BP 125/61 mmHg  Pulse 91  Temp(Src) 98.5 F (36.9 C) (Oral)  Resp 18  Ht 5\' 1"  (1.549 m)  Wt 109 lb (49.442 kg)  BMI 20.61 kg/m2  SpO2 100%  LMP 12/13/2014 (Exact Date) Physical Exam  Constitutional: She is oriented to person, place, and time. She appears well-developed and well-nourished. No distress.  HENT:  Head: Normocephalic and atraumatic.  Eyes: Conjunctivae are normal. Pupils are equal, round, and reactive to light.  Cardiovascular: Normal rate, regular rhythm, normal heart sounds and  intact distal pulses.  Exam reveals no gallop and no friction rub.   No murmur heard. Pulmonary/Chest: Effort normal and breath sounds normal. No respiratory distress. She has no wheezes. She has no rales. She exhibits no tenderness.  Abdominal: Soft. She exhibits no distension and no mass. There is tenderness ( TTPof suprapubic area and LLQ). There is guarding. There is no rebound.  Genitourinary: Vaginal discharge found.   Pt felt extreme pain in vaginal vault when inserting the speculum.    Using a pediatric speculum, able to visualize thick white discharge in vaginal vault, non odorous. Speculum not advanced fully. Cervix not visualized, limited exam due to pain. Pain begins at introitus, throughout vaginal vault.  External vagina appears normal. No lesions noted.   Unable to appreciate fetal heart tones on [place], likely due to early gestational age.   Musculoskeletal: Normal range of motion. She exhibits no edema or tenderness.  Lymphadenopathy:    She has no cervical adenopathy.  Neurological: She is alert and oriented to person, place, and time. No cranial nerve deficit.  Skin: Skin is warm and dry. No rash noted. She is not diaphoretic. No erythema. No pallor.  Psychiatric: She has a normal mood and affect. Her behavior is normal.  Nursing note and vitals reviewed.   ED Course  Procedures (including critical care time) Pt seen for lower abdominal pain, dysuria, a dyspareunia.  Pt was seen 5 days ago in ED Pelvic exam attempted, pt in too much pain.  Pediatric speculum able to be used. Limited pelvic exam due to pain. Able to obtain wet prep. Cervix not visualized.  Pt given tylenol, zofran Pt recommended follow up with OBGYN  Labs Review Labs Reviewed  WET PREP, GENITAL - Abnormal; Notable for the following:    Clue Cells Wet Prep HPF POC FEW (*)    WBC, Wet Prep HPF POC FEW (*)    All other components within normal limits  URINALYSIS, ROUTINE W REFLEX MICROSCOPIC (NOT AT  [facility]) - Abnormal; Notable for the following:    Color, Urine AMBER (*)    APPearance CLOUDY (*)    Ketones, ur >80 (*)    Protein, ur 30 (*)    Leukocytes, UA SMALL (*)    All other components within normal limits  URINE MICROSCOPIC-ADD ON - Abnormal; Notable for the following:    Squamous Epithelial / LPF FEW (*)    Bacteria, UA FEW (*)    Casts HYALINE CASTS (*)    All other components within normal limits  URINE CULTURE    Imaging Review No results found. I have personally reviewed and evaluated these images and lab results as part of my medical decision-making.   EKG Interpretation None      MDM   Final diagnoses:  UTI in pregnancy, first trimester  BV (bacterial vaginosis)  Pregnancy   Pt seen for lower abdominal pain, dysuria, and dyspareunia. Pt states that she has heavy, thick white, vaginal discharge that has a foul odor. Pt seen in ED last Friday. Transvaginal US performed at that time, showed IUP. Ectopic not likely. UA revealed leukocytes and bacteria. Will treat for UTI. Clue cells seen on wet prep. Will treat for BV. GC.chlamydia results came back negative. No external abnormality seen on exam. NO fevers, chills, vaginal bleeding. PID unlikely. No peritoneal signs. Appendicitis unlikely. Pt nauseous, not  anorexic. Pt STRONGLY recommended to follow up with OBGYN. Pt stable for discharge.        Lester Kinsman Pax, PA-C 01/20/15 1403  Marily Memos, MD 01/23/15 [phone]

## 2015-01-21 ENCOUNTER — Inpatient Hospital Stay (HOSPITAL_COMMUNITY)
Admission: AD | Admit: 2015-01-21 | Discharge: 2015-01-21 | Disposition: A | Discharge status: Home / Self Care | Payer: Federal, State, Local not specified - PPO | Source: Ambulatory Visit | Attending: Obstetrics and Gynecology | Admitting: Obstetrics and Gynecology

## 2015-01-21 ENCOUNTER — Encounter (HOSPITAL_COMMUNITY): Payer: Self-pay | Admitting: *Deleted

## 2015-01-21 DIAGNOSIS — F419 Anxiety disorder, unspecified: Secondary | ICD-10-CM | POA: Insufficient documentation

## 2015-01-21 DIAGNOSIS — Z3A01 Less than 8 weeks gestation of pregnancy: Secondary | ICD-10-CM | POA: Insufficient documentation

## 2015-01-21 DIAGNOSIS — O210 Mild hyperemesis gravidarum: Secondary | ICD-10-CM | POA: Diagnosis not present

## 2015-01-21 DIAGNOSIS — O219 Vomiting of pregnancy, unspecified: Secondary | ICD-10-CM | POA: Diagnosis not present

## 2015-01-21 HISTORY — DX: Other specified health status: Z78.9

## 2015-01-21 HISTORY — DX: Anxiety disorder, unspecified: F41.9

## 2015-01-21 LAB — URINALYSIS, ROUTINE W REFLEX MICROSCOPIC
BILIRUBIN URINE: NEGATIVE
GLUCOSE, UA: NEGATIVE mg/dL
Leukocytes, UA: NEGATIVE
Nitrite: NEGATIVE
PH: 7.5 (ref 5.0–8.0)
PROTEIN: NEGATIVE mg/dL
Specific Gravity, Urine: 1.02 (ref 1.005–1.030)
Urobilinogen, UA: 1 mg/dL (ref 0.0–1.0)

## 2015-01-21 LAB — URINE MICROSCOPIC-ADD ON

## 2015-01-21 MED ORDER — LACTATED RINGERS IV BOLUS (SEPSIS)
1000.0000 mL | Freq: Once | INTRAVENOUS | Status: AC
Start: 2015-01-21 — End: 2015-01-21
  Administered 2015-01-21: 1000 mL via INTRAVENOUS

## 2015-01-21 MED ORDER — PROMETHAZINE HCL 25 MG PO TABS: 12.5000 mg | ORAL_TABLET | Freq: Four times a day (QID) | ORAL | Status: DC | PRN

## 2015-01-21 MED ORDER — PROMETHAZINE HCL 25 MG/ML IJ SOLN
25.0000 mg | Freq: Once | INTRAVENOUS | Status: AC
  Administered 2015-01-21: 25 mg via INTRAVENOUS
  Filled 2015-01-21: qty 1

## 2015-01-21 NOTE — MAU Provider Note (Signed)
History     CSN: [id number]  Arrival date and time: 01/21/15 7829   First Provider Initiated Contact with Patient 01/21/15 2029      Chief Complaint  Patient presents with  . Emesis During Pregnancy   HPI Comments: Kathleen Durham is a 19 y.o. G1P0 at [redacted]w[redacted]d who presents today with nausea and vomiting. She states that she has been to the ED three times, but has not been sent home with any antiemetic. She was given a rx for a UTI, but she has not been able to get that filled yet because she vomits every time she has to be in the car. She is unsure of where she will go for prenatal care.   Emesis  This is a new problem. The current episode started in the past 7 days. The problem occurs 5 to 10 times per day. The problem has been gradually worsening. The emesis has an appearance of stomach contents. There has been no fever. Pertinent negatives include no abdominal pain, chills, diarrhea or fever. Risk factors: pregnancy  She has tried nothing for the symptoms.     Past Medical History  Diagnosis Date  . Medical history non-contributory   . Anxiety     Past Surgical History  Procedure Laterality Date  . No past surgeries      History reviewed. No pertinent family history.  Social History  Substance Use Topics  . Smoking status: Never Smoker   . Smokeless tobacco: None  . Alcohol Use: No    Allergies: No Known Allergies  Prescriptions prior to admission  Medication Sig Dispense Refill Last Dose  . metroNIDAZOLE (FLAGYL) 500 MG tablet Take 1 tablet (500 mg total) by mouth 2 (two) times daily. 14 tablet 0   . nitrofurantoin, macrocrystal-monohydrate, (MACROBID) 100 MG capsule Take 1 capsule (100 mg total) by mouth 2 (two) times daily. 10 capsule 0   . Prenatal Vit-Fe Fumarate-FA (PRENATAL COMPLETE) 14-0.4 MG TABS Take 2 tablets by mouth daily. 60 each 12 Past Week at Unknown time    Review of Systems  Constitutional: Negative for fever and chills.  Gastrointestinal:  Positive for nausea and vomiting. Negative for abdominal pain, diarrhea and constipation.  Genitourinary: Positive for dysuria. Negative for urgency and frequency.   Physical Exam   Blood pressure 117/76, pulse 88, temperature 99.2 F (37.3 C), temperature source Oral, resp. rate 20, last menstrual period 12/07/2014, SpO2 100 %.  Physical Exam  Nursing note and vitals reviewed. Constitutional: She is oriented to person, place, and time. She appears well-developed and well-nourished. No distress.  HENT:  Head: Normocephalic.  Cardiovascular: Normal rate.   Respiratory: Effort normal.  GI: Soft. There is no tenderness. There is no rebound.  Neurological: She is alert and oriented to person, place, and time.  Skin: Skin is warm and dry.  Psychiatric: She has a normal mood and affect.    MAU Course  Procedures  MDM 2301: Patient has had 1L D5LR and 1L of LR. She reports feeling better, and it tolerating PO   Assessment and Plan   1. Nausea/vomiting in pregnancy    DC home Comfort measures reviewed  1st Trimester precautions  RX: phenergan #30, 1 RF  Return to MAU as needed [facility] as soon as possible.   Follow-up Information    Schedule an appointment as soon as possible for a visit to follow up.   Contact information:   the provider of your choice from the list provided  Tawnya Crook 01/21/2015, 8:31 PM

## 2015-01-21 NOTE — Discharge Instructions (Signed)
Nausea medication to take during pregnancy:   Unisom (doxylamine succinate 25 mg tablets) Take one tablet daily at bedtime. If symptoms are not adequately controlled, the dose can be increased to a maximum recommended dose of two tablets daily (1/2 tablet in the morning, 1/2 tablet mid-afternoon and one at bedtime).  Vitamin B6  tablets. Take one tablet twice a day (up to 200 mg per day).  Morning Sickness Morning sickness is when you feel sick to your stomach (nauseous) during pregnancy. This nauseous feeling may or may not come with vomiting. It often occurs in the morning but can be a problem any time of day. Morning sickness is most common during the first trimester, but it may continue throughout pregnancy. While morning sickness is unpleasant, it is usually harmless unless you develop severe and continual vomiting (hyperemesis gravidarum). This condition requires more intense treatment.  CAUSES  The cause of morning sickness is not completely known but seems to be related to normal hormonal changes that occur in pregnancy. RISK FACTORS You are at greater risk if you:  Experienced nausea or vomiting before your pregnancy.  Had morning sickness during a previous pregnancy.  Are pregnant with more than one baby, such as twins. TREATMENT  Do not use any medicines (prescription, over-the-counter, or herbal) for morning sickness without first talking to your health care provider. Your health care provider may prescribe or recommend:  Vitamin B6 supplements.  Anti-nausea medicines.  The herbal medicine ginger. HOME CARE INSTRUCTIONS   Only take over-the-counter or prescription medicines as directed by your health care provider.  Taking multivitamins before getting pregnant can prevent or decrease the severity of morning sickness in most women.  Eat a piece of dry toast or unsalted crackers before getting out of bed in the morning.  Eat five or six small meals a day.  Eat dry  and bland foods (rice, baked potato). Foods high in carbohydrates are often helpful.  Do not drink liquids with your meals. Drink liquids between meals.  Avoid greasy, fatty, and spicy foods.  Get someone to cook for you if the smell of any food causes nausea and vomiting.  If you feel nauseous after taking prenatal vitamins, take the vitamins at night or with a snack.  Snack on protein foods (nuts, yogurt, cheese) between meals if you are hungry.  Eat unsweetened gelatins for desserts.  Wearing an acupressure wristband (worn for sea sickness) may be helpful.  Acupuncture may be helpful.  Do not smoke.  Get a humidifier to keep the air in your house free of odors.  Get plenty of fresh air. SEEK MEDICAL CARE IF:   Your home remedies are not working, and you need medicine.  You feel dizzy or lightheaded.  You are losing weight. SEEK IMMEDIATE MEDICAL CARE IF:   You have persistent and uncontrolled nausea and vomiting.  You pass out (faint). MAKE SURE YOU:  Understand these instructions.  Will watch your condition.  Will get help right away if you are not doing well or get worse. Document Released: 07/07/2006 Document Revised: 05/21/2013 Document Reviewed: 10/31/2012 [facility] Patient Information 2015 [place], [place]. This information is not intended to replace advice given to you by your health care provider. Make sure you discuss any questions you have with your health care provider.    Prenatal Care [facility]    [facility]  [facility]  [phone]     Phone: [phone]  [facility] [employer] [facility]  [email or web address]: [phone]  Phone: [phone]         [facility]     Phone: [phone]  Phone: [phone]           [facility] [place]                hone: [phone]  Phone:  [phone]         [facility] [address] Francoise Ceo      Phone: [phone]  Phone: [phone]         [facility]                Phone: [phone]  [facility]   [address] [place])          Phone: [phone] [facility]   Phone: [phone]

## 2015-01-22 ENCOUNTER — Inpatient Hospital Stay (HOSPITAL_COMMUNITY)
Admission: AD | Admit: 2015-01-22 | Discharge: 2015-01-22 | Disposition: A | Discharge status: Home / Self Care | Payer: Federal, State, Local not specified - PPO | Source: Ambulatory Visit | Attending: Family Medicine | Admitting: Family Medicine

## 2015-01-22 ENCOUNTER — Encounter (HOSPITAL_COMMUNITY): Payer: Self-pay | Admitting: *Deleted

## 2015-01-22 ENCOUNTER — Telehealth: Payer: Self-pay | Admitting: *Deleted

## 2015-01-22 DIAGNOSIS — Z3A01 Less than 8 weeks gestation of pregnancy: Secondary | ICD-10-CM | POA: Diagnosis not present

## 2015-01-22 DIAGNOSIS — O210 Mild hyperemesis gravidarum: Secondary | ICD-10-CM | POA: Diagnosis present

## 2015-01-22 DIAGNOSIS — O219 Vomiting of pregnancy, unspecified: Secondary | ICD-10-CM

## 2015-01-22 LAB — URINALYSIS, ROUTINE W REFLEX MICROSCOPIC
BILIRUBIN URINE: NEGATIVE
Glucose, UA: 100 mg/dL — AB
HGB URINE DIPSTICK: NEGATIVE
Leukocytes, UA: NEGATIVE
Nitrite: NEGATIVE
PH: 7 (ref 5.0–8.0)
Protein, ur: NEGATIVE mg/dL
SPECIFIC GRAVITY, URINE: 1.01 (ref 1.005–1.030)
UROBILINOGEN UA: 0.2 mg/dL (ref 0.0–1.0)

## 2015-01-22 LAB — URINE CULTURE

## 2015-01-22 MED ORDER — PROMETHAZINE HCL 25 MG RE SUPP: 25.0000 mg | Freq: Four times a day (QID) | RECTAL | Status: DC | PRN

## 2015-01-22 MED ORDER — DEXTROSE IN LACTATED RINGERS 5 % IV SOLN
25.0000 mg | Freq: Once | INTRAVENOUS | Status: AC
  Administered 2015-01-22: 25 mg via INTRAVENOUS
  Filled 2015-01-22: qty 1

## 2015-01-22 MED ORDER — DOXYLAMINE-PYRIDOXINE 10-10 MG PO TBEC: 2.0000 | DELAYED_RELEASE_TABLET | Freq: Every day | ORAL | Status: DC

## 2015-01-22 NOTE — MAU Provider Note (Signed)
History     CSN: [id number]  Arrival date and time: 01/22/15 2007   First Provider Initiated Contact with Patient 01/22/15 2102      Chief Complaint  Patient presents with  . Morning Sickness   HPI Comments: Kathleen Durham is a 19 y.o. G1P0 at [redacted]w[redacted]d who presents today with nausea and vomiting. She states that she took one dose of phenergan today. She fell asleep for about 2-3 hours, and when she woke up she vomited about 3 times. She has not taken anything since. She has not vomited since. She states that she has been able to eat some small amount of food, and drink water.   Emesis  The current episode started in the past 7 days. The problem occurs less than 2 times per day. The problem has been gradually improving. The emesis has an appearance of stomach contents. There has been no fever. Pertinent negatives include no abdominal pain, diarrhea or fever. Risk factors: pregnancy  Treatments tried: phenergan  The treatment provided mild relief.     Past Medical History  Diagnosis Date  . Medical history non-contributory   . Anxiety     Past Surgical History  Procedure Laterality Date  . No past surgeries      History reviewed. No pertinent family history.  Social History  Substance Use Topics  . Smoking status: Never Smoker   . Smokeless tobacco: None  . Alcohol Use: No    Allergies: No Known Allergies  Prescriptions prior to admission  Medication Sig Dispense Refill Last Dose  . anti-nausea (EMETROL) solution Take 15 mLs by mouth every 15 (fifteen) minutes as needed for nausea or vomiting.   01/21/2015 at Unknown time  . metroNIDAZOLE (FLAGYL) 500 MG tablet Take 1 tablet (500 mg total) by mouth 2 (two) times daily. 14 tablet 0 not pu yet  . nitrofurantoin, macrocrystal-monohydrate, (MACROBID) 100 MG capsule Take 1 capsule (100 mg total) by mouth 2 (two) times daily. 10 capsule 0 not pu yet  . Prenatal Vit-Fe Fumarate-FA (PRENATAL COMPLETE) 14-0.4 MG TABS Take 2  tablets by mouth daily. 60 each 12 Past Week at Unknown time  . promethazine (PHENERGAN) 25 MG tablet Take 0.5-1 tablets (12.5-25 mg total) by mouth every 6 (six) hours as needed. (Patient taking differently: Take 12.5-25 mg by mouth every 6 (six) hours as needed for nausea or vomiting. ) 30 tablet 1 01/22/2015 at Unknown time    Review of Systems  Constitutional: Negative for fever.  Gastrointestinal: Positive for nausea and vomiting. Negative for abdominal pain, diarrhea and constipation.  Genitourinary: Negative for dysuria, urgency and frequency.   Physical Exam   Last menstrual period 12/07/2014.  Physical Exam  Nursing note and vitals reviewed. Constitutional: She is oriented to person, place, and time. She appears well-developed and well-nourished. No distress.  HENT:  Head: Normocephalic.  Cardiovascular: Normal rate.   Respiratory: Effort normal.  GI: Soft. There is no tenderness. There is no rebound.  Neurological: She is alert and oriented to person, place, and time.  Skin: Skin is warm and dry.  Psychiatric: She has a normal mood and affect.   Results for orders placed or performed during the hospital encounter of 01/22/15 (from the past 24 hour(s))  Urinalysis, Routine w reflex microscopic (not at [facility])     Status: Abnormal   Collection Time: 01/22/15  9:27 PM  Result Value Ref Range   Color, Urine YELLOW YELLOW   APPearance CLEAR CLEAR   Specific Gravity, Urine 1.010  1.005 - 1.030   pH 7.0 5.0 - 8.0   Glucose, UA 100 (A) NEGATIVE mg/dL   Hgb urine dipstick NEGATIVE NEGATIVE   Bilirubin Urine NEGATIVE NEGATIVE   Ketones, ur >80 (A) NEGATIVE mg/dL   Protein, ur NEGATIVE NEGATIVE mg/dL   Urobilinogen, UA 0.2 0.0 - 1.0 mg/dL   Nitrite NEGATIVE NEGATIVE   Leukocytes, UA NEGATIVE NEGATIVE    MAU Course  Procedures  MDM Patient has had D5LR with phenergan. She is feeling better at this time. She is tolerating PO   Assessment and Plan   1. Nausea/vomiting in  pregnancy    DC home Comfort measures reviewed  1st Trimester precautions Advised patient to take diglegis every day regardless of how she is feeling. Take phenergan on a schedule either orally or rectally.  RX: phenergan suppositories, dicelgis  Return to MAU as needed FU with OB as planned  Follow-up Information    Schedule an appointment as soon as possible for a visit with [facility] [place].   Contact information:   [address] [facility] [phone]        Tawnya Crook 01/22/2015, 9:28 PM

## 2015-01-22 NOTE — Telephone Encounter (Signed)
Pt called stating that "we" did not call her Rx in to pharmacy.  NCM explained that we do not usually call in Rx.  NCM reviewed her chart to find that, indeed her Rx was given to her.  NCM asked the pt to thumb through papers again.  Pt found Rx and states she will take them to pharmacy promptly.

## 2015-01-22 NOTE — Discharge Instructions (Signed)
Diclegis instructions  Take two tablets daily at bedtime. If symptoms are not adequately controlled, the dose can be increased to a maximum recommended dose of four tablets daily (one in the morning, one mid-afternoon and two at bedtime)  Delayed-release tablets containing 10 mg doxylamine succinate and 10 mg pyridoxine hydrochloride.   Morning Sickness Morning sickness is when you feel sick to your stomach (nauseous) during pregnancy. This nauseous feeling may or may not come with vomiting. It often occurs in the morning but can be a problem any time of day. Morning sickness is most common during the first trimester, but it may continue throughout pregnancy. While morning sickness is unpleasant, it is usually harmless unless you develop severe and continual vomiting (hyperemesis gravidarum). This condition requires more intense treatment.  CAUSES  The cause of morning sickness is not completely known but seems to be related to normal hormonal changes that occur in pregnancy. RISK FACTORS You are at greater risk if you:  Experienced nausea or vomiting before your pregnancy.  Had morning sickness during a previous pregnancy.  Are pregnant with more than one baby, such as twins. TREATMENT  Do not use any medicines (prescription, over-the-counter, or herbal) for morning sickness without first talking to your health care provider. Your health care provider may prescribe or recommend:  Vitamin B6 supplements.  Anti-nausea medicines.  The herbal medicine ginger. HOME CARE INSTRUCTIONS   Only take over-the-counter or prescription medicines as directed by your health care provider.  Taking multivitamins before getting pregnant can prevent or decrease the severity of morning sickness in most women.  Eat a piece of dry toast or unsalted crackers before getting out of bed in the morning.  Eat five or six small meals a day.  Eat dry and bland foods (rice, baked potato). Foods high in  carbohydrates are often helpful.  Do not drink liquids with your meals. Drink liquids between meals.  Avoid greasy, fatty, and spicy foods.  Get someone to cook for you if the smell of any food causes nausea and vomiting.  If you feel nauseous after taking prenatal vitamins, take the vitamins at night or with a snack.  Snack on protein foods (nuts, yogurt, cheese) between meals if you are hungry.  Eat unsweetened gelatins for desserts.  Wearing an acupressure wristband (worn for sea sickness) may be helpful.  Acupuncture may be helpful.  Do not smoke.  Get a humidifier to keep the air in your house free of odors.  Get plenty of fresh air. SEEK MEDICAL CARE IF:   Your home remedies are not working, and you need medicine.  You feel dizzy or lightheaded.  You are losing weight. SEEK IMMEDIATE MEDICAL CARE IF:   You have persistent and uncontrolled nausea and vomiting.  You pass out (faint). MAKE SURE YOU:  Understand these instructions.  Will watch your condition.  Will get help right away if you are not doing well or get worse. Document Released: 07/07/2006 Document Revised: 05/21/2013 Document Reviewed: 10/31/2012 [facility] Patient Information 2015 [place], [place]. This information is not intended to replace advice given to you by your health care provider. Make sure you discuss any questions you have with your health care provider.

## 2015-01-23 ENCOUNTER — Encounter (HOSPITAL_COMMUNITY): Payer: Self-pay

## 2015-08-15 ENCOUNTER — Emergency Department (HOSPITAL_COMMUNITY)
Admission: EM | Admit: 2015-08-15 | Discharge: 2015-08-15 | Disposition: A | Discharge status: Home / Self Care | Payer: Federal, State, Local not specified - PPO | Attending: Emergency Medicine | Admitting: Emergency Medicine

## 2015-08-15 ENCOUNTER — Encounter (HOSPITAL_COMMUNITY): Payer: Self-pay

## 2015-08-15 DIAGNOSIS — Z79899 Other long term (current) drug therapy: Secondary | ICD-10-CM | POA: Diagnosis not present

## 2015-08-15 DIAGNOSIS — F419 Anxiety disorder, unspecified: Secondary | ICD-10-CM | POA: Diagnosis not present

## 2015-08-15 DIAGNOSIS — N739 Female pelvic inflammatory disease, unspecified: Secondary | ICD-10-CM | POA: Diagnosis not present

## 2015-08-15 DIAGNOSIS — R300 Dysuria: Secondary | ICD-10-CM | POA: Diagnosis present

## 2015-08-15 LAB — URINALYSIS, ROUTINE W REFLEX MICROSCOPIC
Bilirubin Urine: NEGATIVE
Glucose, UA: NEGATIVE mg/dL
HGB URINE DIPSTICK: NEGATIVE
Ketones, ur: NEGATIVE mg/dL
NITRITE: NEGATIVE
PH: 7 (ref 5.0–8.0)
Protein, ur: NEGATIVE mg/dL
SPECIFIC GRAVITY, URINE: 1.024 (ref 1.005–1.030)

## 2015-08-15 LAB — WET PREP, GENITAL
Clue Cells Wet Prep HPF POC: NONE SEEN
SPERM: NONE SEEN
TRICH WET PREP: NONE SEEN
YEAST WET PREP: NONE SEEN

## 2015-08-15 LAB — URINE MICROSCOPIC-ADD ON
Bacteria, UA: NONE SEEN
RBC / HPF: NONE SEEN RBC/hpf (ref 0–5)

## 2015-08-15 MED ORDER — DOXYCYCLINE HYCLATE 100 MG PO CAPS: 100.0000 mg | ORAL_CAPSULE | Freq: Two times a day (BID) | ORAL | Status: DC

## 2015-08-15 MED ORDER — LIDOCAINE HCL (PF) 1 % IJ SOLN
INTRAMUSCULAR | Status: AC
  Administered 2015-08-15: 0.9 mL
  Filled 2015-08-15: qty 5

## 2015-08-15 MED ORDER — CEFTRIAXONE SODIUM 250 MG IJ SOLR
250.0000 mg | Freq: Once | INTRAMUSCULAR | Status: AC
  Administered 2015-08-15: 250 mg via INTRAMUSCULAR
  Filled 2015-08-15: qty 250

## 2015-08-15 NOTE — Discharge Instructions (Signed)
Follow up with your PCP if your symptoms do not improve. Return to ED with new, worsening or concerning symptoms.    Pelvic Inflammatory Disease Pelvic inflammatory disease (PID) refers to an infection in some or all of the female organs. The infection can be in the uterus, ovaries, fallopian tubes, or the surrounding tissues in the pelvis. PID can cause abdominal or pelvic pain that comes on suddenly (acute pelvic pain). PID is a serious infection because it can lead to lasting (chronic) pelvic pain or the inability to have children (infertility). CAUSES This condition is most often caused by an infection that is spread during sexual contact. However, the infection can also be caused by the normal bacteria that are found in the vaginal tissues if these bacteria travel upward into the reproductive organs. PID can also occur following:  The birth of a baby.  A miscarriage.  An abortion.  Major pelvic surgery.  The use of an intrauterine device (IUD).  A sexual assault. RISK FACTORS This condition is more likely to develop in women who:  Are younger than 22 years of age.  Are [facility].  Use nonbarrier contraception.  Have multiple sexual partners.  Have sex with someone who has symptoms of an STD (sexually transmitted disease).  Use oral contraception. At times, certain behaviors can also increase the possibility of getting PID, such as:  Using a vaginal douche.  Having an IUD in place. SYMPTOMS Symptoms of this condition include:  Abdominal or pelvic pain.  Fever.  Chills.  Abnormal vaginal discharge.  Abnormal uterine bleeding.  Unusual pain shortly after the end of a menstrual period.  Painful urination.  Pain with sexual intercourse.  Nausea and vomiting. DIAGNOSIS To diagnose this condition, your health care provider will do a physical exam and take your medical history. A pelvic exam typically reveals great tenderness in the uterus  and the surrounding pelvic tissues. You may also have tests, such as:  Lab tests, including a pregnancy test, blood tests, and urine test.  Culture tests of the vagina and cervix to check for an STD.  Ultrasound.  A laparoscopic procedure to look inside the pelvis.  Examining vaginal secretions under a microscope. TREATMENT Treatment for this condition may involve one or more approaches.  Antibiotic medicines may be prescribed to be taken by mouth.  Sexual partners may need to be treated if the infection is caused by an STD.  For more severe cases, hospitalization may be needed to give antibiotics directly into a vein through an IV tube.  Surgery may be needed if other treatments do not help, but this is rare. It may take weeks until you are completely well. If you are diagnosed with PID, you should also be checked for human immunodeficiency virus (HIV). Your health care provider may test you for infection again 3 months after treatment. You should not have unprotected sex. HOME CARE INSTRUCTIONS  Take over-the-counter and prescription medicines only as told by your health care provider.  If you were prescribed an antibiotic medicine, take it as told by your health care provider. Do not stop taking the antibiotic even if you start to feel better.  Do not have sexual intercourse until treatment is completed or as told by your health care provider. If PID is confirmed, your recent sexual partners will need treatment, especially if you had unprotected sex.  Keep all follow-up visits as told by your health care provider. This is important. SEEK MEDICAL CARE IF:  You have increased or abnormal vaginal discharge.  Your pain does not improve.  You vomit.  You have a fever.  You cannot tolerate your medicines.  Your partner has an STD.  You have pain when you urinate. SEEK IMMEDIATE MEDICAL CARE IF:  You have increased abdominal or pelvic pain.  You have chills.  Your  symptoms are not better in 72 hours even with treatment.   This information is not intended to replace advice given to you by your health care provider. Make sure you discuss any questions you have with your health care provider.   Document Released: 05/16/2005 Document Revised: 02/04/2015 Document Reviewed: 06/23/2014 Elsevier Interactive Patient Education [employer]

## 2015-08-15 NOTE — ED Notes (Signed)
Pt reports onset yesterday vaginal irritation, redness and creamy/clear thin discharge with no odor.  Pain when urinating.  Recent dx- two weeks ago- BV and chlymydia.  Pt still taking Flagyl.

## 2015-08-15 NOTE — ED Provider Notes (Signed)
CSN: [id number]     Arrival date & time 08/15/15  1101 History   First MD Initiated Contact with Patient 08/15/15 1225     Chief Complaint  Patient presents with  . Vaginitis   HPI  Kathleen Durham is a 20 year old female presenting with vaginal irritation. Onset of symptoms was yesterday. She is complaining severe pain of the labia and around the urethra. She endorses severe dysuria. She is also complaining of voluminous white, creamy vaginal discharge. Endorses dyspareunia. She was diagnosed with BV and chlamydia 2 weeks ago. She received treatment for both and is still taking her Flagyl. She does admit to missing doses. Denies recent unprotected intercourse. Denies fevers, chills, dizziness, syncope, abdominal pain, nausea or vomiting. She has no other complaints today  Past Medical History  Diagnosis Date  . Medical history non-contributory   . Anxiety    Past Surgical History  Procedure Laterality Date  . No past surgeries     History reviewed. No pertinent family history. Social History  Substance Use Topics  . Smoking status: Never Smoker   . Smokeless tobacco: None  . Alcohol Use: No   OB History    Gravida Para Term Preterm AB TAB SAB Ectopic Multiple Living   1         0     Review of Systems  Genitourinary: Positive for dysuria, vaginal discharge and pelvic pain.  All other systems reviewed and are negative.     Allergies  Review of patient's allergies indicates no known allergies.  Home Medications   Prior to Admission medications   Medication Sig Start Date End Date Taking? Authorizing Provider  metroNIDAZOLE (FLAGYL) 500 MG tablet Take 1 tablet (500 mg total) by mouth 2 (two) times daily. 01/20/15  Yes Samantha Tripp Dowless, PA-C  anti-nausea (EMETROL) solution Take 15 mLs by mouth every 15 (fifteen) minutes as needed for nausea or vomiting.    Historical Provider, MD  doxycycline (VIBRAMYCIN) 100 MG capsule Take 1 capsule (100 mg total) by mouth 2 (two)  times daily. 08/15/15   Nikolos Billig, PA-C  Doxylamine-Pyridoxine (DICLEGIS) 10-10 MG TBEC Take 2 tablets by mouth at bedtime. If symptoms persist add one tablet every morning starting on day 3. If symptoms persist add 1 tablet every afternoon on day 4. 01/22/15   Armando ReichertHeather D Hogan, CNM  nitrofurantoin, macrocrystal-monohydrate, (MACROBID) 100 MG capsule Take 1 capsule (100 mg total) by mouth 2 (two) times daily. 01/20/15   Samantha Tripp Dowless, PA-C  Prenatal Vit-Fe Fumarate-FA (PRENATAL COMPLETE) 14-0.4 MG TABS Take 2 tablets by mouth daily. 01/16/15   Hannah Muthersbaugh, PA-C  promethazine (PHENERGAN) 25 MG suppository Place 1 suppository (25 mg total) rectally every 6 (six) hours as needed for nausea or vomiting. 01/22/15   Armando ReichertHeather D Hogan, CNM  promethazine (PHENERGAN) 25 MG tablet Take 0.5-1 tablets (12.5-25 mg total) by mouth every 6 (six) hours as needed. Patient taking differently: Take 12.5-25 mg by mouth every 6 (six) hours as needed for nausea or vomiting.  01/21/15   Armando ReichertHeather D Hogan, CNM   BP 110/58 mmHg  Pulse 83  Temp(Src) 98.5 F (36.9 C) (Oral)  Resp 14  Ht 5\' 1"  (1.549 m)  Wt 50.984 kg  BMI 21.25 kg/m2  SpO2 98%  LMP 07/18/2015  Breastfeeding? Unknown Physical Exam  Constitutional: She appears well-developed and well-nourished. No distress.  Appears uncomfortable. Nontoxic appearing.  HENT:  Head: Normocephalic and atraumatic.  Right Ear: External ear normal.  Left Ear: External ear normal.  Eyes: Conjunctivae are normal. Right eye exhibits no discharge. Left eye exhibits no discharge. No scleral icterus.  Neck: Normal range of motion.  Cardiovascular: Normal rate.   Pulmonary/Chest: Effort normal.  Abdominal: Soft. She exhibits no distension. There is no tenderness. There is no rebound and no guarding.  Genitourinary: There is tenderness on the right labia. There is no rash or lesion on the right labia. There is tenderness on the left labia. There is no rash or lesion on  the left labia. Cervix exhibits motion tenderness and discharge. Cervix exhibits no friability. Right adnexum displays tenderness. Left adnexum displays tenderness. There is erythema and tenderness in the vagina. Vaginal discharge found.  Patient extremely uncomfortable on pelvic exam. Vaginal canal appears erythematous. Copious amounts of white discharge noted. Cervical motion tenderness and adnexal tenderness present.   Musculoskeletal: Normal range of motion.  Moves all extremities spontaneously  Lymphadenopathy:       Right: No inguinal adenopathy present.       Left: No inguinal adenopathy present.  Neurological: She is alert. Coordination normal.  Skin: Skin is warm and dry.  Psychiatric: She has a normal mood and affect. Her behavior is normal.  Nursing note and vitals reviewed.   ED Course  Procedures (including critical care time) Labs Review Labs Reviewed  WET PREP, GENITAL - Abnormal; Notable for the following:    WBC, Wet Prep HPF POC TOO NUMEROUS TO COUNT (*)    All other components within normal limits  URINALYSIS, ROUTINE W REFLEX MICROSCOPIC (NOT AT [facility]) - Abnormal; Notable for the following:    Leukocytes, UA SMALL (*)    All other components within normal limits  URINE MICROSCOPIC-ADD ON - Abnormal; Notable for the following:    Squamous Epithelial / LPF 0-5 (*)    All other components within normal limits  GC/CHLAMYDIA PROBE AMP ([facility]) NOT AT [facility]    Imaging Review No results found. I have personally reviewed and evaluated these images and lab results as part of my medical decision-making.   EKG Interpretation None      MDM   Final diagnoses:  PID (pelvic inflammatory disease)   20 year old female presenting with severe pelvic pain, dysuria and vaginal discharge. Recently diagnosed with chlamydia and treated with azithromycin. Afebrile. Hemodynamically stable and nontoxic appearing. Abdomen is nontender to palpation and without peritoneal signs.  Copious amounts of white vaginal discharge noted on pelvic exam. Cervical motion tenderness present. Too numerous to count white blood cell on wet prep. Patient likely has PID. Given 250 mg ceftriaxone in emergency department. She does not meet serious criteria and is appropriate for outpatient treatment. Will discharge with doxycycline. Patient is to follow-up with her PCP. I have also discussed reasons to return immediately to the emergency department. Return precautions given in discharge paperwork and discussed with pt at bedside. Pt stable for discharge     Kathleen Heimlich, PA-C 08/15/15 1520  Donnetta Hutching, MD 08/16/15 [phone]

## 2015-08-17 LAB — GC/CHLAMYDIA PROBE AMP (~~LOC~~) NOT AT ARMC
CHLAMYDIA, DNA PROBE: NEGATIVE
Neisseria Gonorrhea: NEGATIVE

## 2015-09-08 ENCOUNTER — Emergency Department (HOSPITAL_COMMUNITY)
Admission: EM | Admit: 2015-09-08 | Discharge: 2015-09-08 | Disposition: A | Discharge status: Home / Self Care | Payer: Federal, State, Local not specified - PPO | Attending: Emergency Medicine | Admitting: Emergency Medicine

## 2015-09-08 ENCOUNTER — Emergency Department (HOSPITAL_COMMUNITY): Payer: Federal, State, Local not specified - PPO

## 2015-09-08 ENCOUNTER — Encounter (HOSPITAL_COMMUNITY): Payer: Self-pay | Admitting: Emergency Medicine

## 2015-09-08 DIAGNOSIS — F419 Anxiety disorder, unspecified: Secondary | ICD-10-CM | POA: Diagnosis not present

## 2015-09-08 DIAGNOSIS — Z792 Long term (current) use of antibiotics: Secondary | ICD-10-CM | POA: Insufficient documentation

## 2015-09-08 DIAGNOSIS — S80212A Abrasion, left knee, initial encounter: Secondary | ICD-10-CM | POA: Insufficient documentation

## 2015-09-08 DIAGNOSIS — Z79899 Other long term (current) drug therapy: Secondary | ICD-10-CM | POA: Insufficient documentation

## 2015-09-08 DIAGNOSIS — S8011XA Contusion of right lower leg, initial encounter: Secondary | ICD-10-CM | POA: Diagnosis not present

## 2015-09-08 DIAGNOSIS — S50812A Abrasion of left forearm, initial encounter: Secondary | ICD-10-CM | POA: Insufficient documentation

## 2015-09-08 DIAGNOSIS — Y9389 Activity, other specified: Secondary | ICD-10-CM | POA: Insufficient documentation

## 2015-09-08 DIAGNOSIS — Y92410 Unspecified street and highway as the place of occurrence of the external cause: Secondary | ICD-10-CM | POA: Insufficient documentation

## 2015-09-08 DIAGNOSIS — Y999 Unspecified external cause status: Secondary | ICD-10-CM | POA: Diagnosis not present

## 2015-09-08 DIAGNOSIS — S00511A Abrasion of lip, initial encounter: Secondary | ICD-10-CM | POA: Insufficient documentation

## 2015-09-08 DIAGNOSIS — R55 Syncope and collapse: Secondary | ICD-10-CM | POA: Insufficient documentation

## 2015-09-08 DIAGNOSIS — T07XXXA Unspecified multiple injuries, initial encounter: Secondary | ICD-10-CM

## 2015-09-08 DIAGNOSIS — S8992XA Unspecified injury of left lower leg, initial encounter: Secondary | ICD-10-CM | POA: Diagnosis present

## 2015-09-08 DIAGNOSIS — S0990XA Unspecified injury of head, initial encounter: Secondary | ICD-10-CM | POA: Insufficient documentation

## 2015-09-08 MED ORDER — TRAMADOL HCL 50 MG PO TABS: 50.0000 mg | ORAL_TABLET | Freq: Four times a day (QID) | ORAL | Status: DC | PRN

## 2015-09-08 MED ORDER — IBUPROFEN 800 MG PO TABS: 800.0000 mg | ORAL_TABLET | Freq: Three times a day (TID) | ORAL | Status: AC | PRN

## 2015-09-08 NOTE — ED Notes (Signed)
Patient able to ambulate independently  

## 2015-09-08 NOTE — ED Notes (Signed)
Patient transported to X-ray 

## 2015-09-08 NOTE — Discharge Instructions (Signed)
Return here as needed. Your x-rays were normal.  °
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
## 2015-11-17 ENCOUNTER — Inpatient Hospital Stay (HOSPITAL_COMMUNITY)
Admission: RE | Admit: 2015-11-17 | Discharge: 2015-11-19 | DRG: 882 | Disposition: A | Discharge status: Home / Self Care | Payer: Federal, State, Local not specified - PPO | Attending: Psychiatry | Admitting: Psychiatry

## 2015-11-17 ENCOUNTER — Encounter (HOSPITAL_COMMUNITY): Payer: Self-pay

## 2015-11-17 DIAGNOSIS — G4700 Insomnia, unspecified: Secondary | ICD-10-CM | POA: Diagnosis present

## 2015-11-17 DIAGNOSIS — F410 Panic disorder [episodic paroxysmal anxiety] without agoraphobia: Secondary | ICD-10-CM | POA: Diagnosis present

## 2015-11-17 DIAGNOSIS — R45851 Suicidal ideations: Secondary | ICD-10-CM | POA: Diagnosis present

## 2015-11-17 DIAGNOSIS — F4323 Adjustment disorder with mixed anxiety and depressed mood: Secondary | ICD-10-CM | POA: Diagnosis present

## 2015-11-17 DIAGNOSIS — F32A Depression, unspecified: Secondary | ICD-10-CM | POA: Diagnosis present

## 2015-11-17 DIAGNOSIS — F329 Major depressive disorder, single episode, unspecified: Secondary | ICD-10-CM | POA: Diagnosis present

## 2015-11-17 MED ORDER — ACETAMINOPHEN 325 MG PO TABS: 650.0000 mg | ORAL_TABLET | Freq: Four times a day (QID) | ORAL | Status: DC | PRN

## 2015-11-17 MED ORDER — HYDROXYZINE HCL 25 MG PO TABS: 25.0000 mg | ORAL_TABLET | Freq: Four times a day (QID) | ORAL | Status: DC | PRN

## 2015-11-17 MED ORDER — ALUM & MAG HYDROXIDE-SIMETH 200-200-20 MG/5ML PO SUSP: 30.0000 mL | ORAL | Status: DC | PRN

## 2015-11-17 MED ORDER — MAGNESIUM HYDROXIDE 400 MG/5ML PO SUSP: 30.0000 mL | Freq: Every day | ORAL | Status: DC | PRN

## 2015-11-17 MED ORDER — TRAZODONE HCL 50 MG PO TABS: 50.0000 mg | ORAL_TABLET | Freq: Every evening | ORAL | Status: DC | PRN

## 2015-11-17 NOTE — [facility] Counselor (Signed)
[facility], pt's mom just called. Per mom, pt should be arriving for assessment momentarily. Per mom, pt texted mom today and pt threatened to kill herself by taking a whole bottle of ibuprofen. Pt's mom Latina Ross [phone].  Evette Cristalaroline Paige Jaydeen Darley, [place] Therapeutic Triage Specialist

## 2015-11-17 NOTE — Tx Team (Signed)
Initial Interdisciplinary Treatment Plan   PATIENT STRESSORS: Legal issue Marital or family conflict Occupational concerns   PATIENT STRENGTHS: Wellsite geologistCommunication skills General fund of knowledge Motivation for treatment/growth   PROBLEM LIST: Problem List/Patient Goals Date to be addressed Date deferred Reason deferred Estimated date of resolution  Depression 11/17/15     Anxiety 11/17/15     Suicidal ideation 11/17/15     "I want to be with my family" 11/17/15     "I want to learn to take care of myself and open up more." 11/17/15                              DISCHARGE CRITERIA:  Improved stabilization in mood, thinking, and/or behavior Verbal commitment to aftercare and medication compliance  PRELIMINARY DISCHARGE PLAN: Outpatient therapy  PATIENT/FAMIILY INVOLVEMENT: This treatment plan has been presented to and reviewed with the patient, Kathleen Durham.  The patient and family have been given the opportunity to ask questions and make suggestions.  Norm ParcelHeather V Zacchaeus Halm 11/17/2015, 10:01 PM

## 2015-11-17 NOTE — Progress Notes (Signed)
Kathleen Durham is a 20 year old female being admitted voluntarily to [facility] as a walk in.  Her mother called to report that she made statements that she was suicidal and that she was going to take an OD.  Her mother encouraged her to come in for an assessment.  She currently denies suicidal ideation but states "I just wanted to come in to try to get a number for a therapist so I can call personally when I need to vent."  She denies HI or A/V hallucinations.  She was tearful and angry about being admitted but was able to calm down to complete the admission.  She endorsees depression, feelings of being overwhelmed with work/school, bills and not feeling good enough for her boyfriend.  "I just want to go home.  Do you think I can leave tomorrow?"  Encouraged her to talk with the doctor tomorrow about plan for discharge.  Admission paperwork completed and signed.  Belongings searched and secured in locker # 5.  Skin assessment completed and noted tattoo and no other skin issues.  Q 15 minute checks initiated for safety.  We will monitor the progress towards her goals.

## 2015-11-17 NOTE — BH Assessment (Signed)
Tele Assessment Note   Kathleen Durham is an 20 y.o. female. [facility]. She is cooperative and oriented x 4. Pt is soft spoken. She endorses SI. She states she texted her mom today and told mom that when pt got off work tonight "[place] was [place] lot of ibuprofen" in suicide attempt. Pt denies hx of attempts. She reports hx of cutting and says last time she cut was approx 6 mos ago. Pt reports her mom and pt's 22 yo sister have depression. She sts her sister had one suicide attempt when pt was younger. Pt endorses SI "since I was 12." She endorses guilt, worthlessness, hypersomnia, isolating bx, irritability, mild anxiety, fatigue, and tearfulness. Pt cries during assessment. Pt sts she lost weight when her parents got divorced in 2011 and she hasn't been able to gain the weight back. She reports poor appetite and says she only eats once a night. She works at [employer] and she has a small laceration on L index finger. Pt sts the cut happened today at work when she was chopping too fast. AC Linsey cleaned pt's cut and put on band aid. Pt denies HI. She denies [facility] and no delusions noted. Pt sts she went to a counselor at age 20 or [age] when she and her mom weren't getting along. She sts they get along well now. Pt says she and her boyfriend of one year finally broke up this am. She says they had been fighting for the past 4 weeks. Pt denies substance use. She denies hx of abuse. Pt sts she lives w/ her dad, dad's gf, and pt's 22 yo sister lives there occasionally. 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 She reports she stopped school for the semester because she couldn't get herself to class.   Diagnosis: MDD, Recurrent, Severe without Psychotic Features  Past Medical History:  Past Medical History  Diagnosis Date  . Medical history non-contributory   . Anxiety     Past Surgical History  Procedure Laterality Date  . No past surgeries      Family History: No  family history on file.  Social History:  reports that she has never smoked. She does not have any smokeless tobacco history on file. She reports that she does not drink alcohol or use illicit drugs.  Additional Social History:  Alcohol / Drug Use Pain Medications: pt denies abuse - see pta meds list Prescriptions: pt denies abuse - see pta meds list  Over the Counter: pt denies abuse - see pta meds list History of alcohol / drug use?: No history of alcohol / drug abuse Longest period of sobriety (when/how long): n/a  CIWA:   COWS:    PATIENT STRENGTHS: (choose at least two) Ability for insight Average or above average intelligence Capable of independent living Communication skills General fund of knowledge Physical Health Supportive family/friends Work skills  Allergies: No Known Allergies  Home Medications:  (Not in a hospital admission)  OB/GYN Status:  Patient's last menstrual period was 12/07/2014.  General Assessment Data [facility] TTS Assessment: In system Is this a Tele or Face-to-Face Assessment?: Face-to-Face Is this an Initial Assessment or a Re-assessment for this encounter?: Initial Assessment Marital status: Single Living Arrangements: Parent, Non-relatives/Friends (dad, dad's girlfriend, pt's 22 yo sister sometimes) Can pt return to current living arrangement?: Yes Admission Status: Voluntary Is patient capable of signing voluntary admission?: Yes Referral Source: Self/Family/Friend Insurance type:  blue cross     Crisis Care Plan Living Arrangements: Parent, Non-relatives/Friends (dad, dad's girlfriend, pt's 68 yo sister sometimes) Name of Psychiatrist: none Name of Therapist: none  Education Status Is patient currently in school?: Yes Current Grade: 16 Highest grade of school patient has completed: 15 Name of school: [employer]  Risk to self with the past 6 months Suicidal Ideation: Yes-Currently Present Has  patient been a risk to self within the past 6 months prior to admission? : Yes Is patient at risk for suicide?: Yes Suicidal Plan?: Yes-Currently Present Has patient had any suicidal plan within the past 6 months prior to admission? : No Specify Current Suicidal Plan: pt told mom she planned to "take a lot of ibuprofen" Access to Means: Yes Specify Access to Suicidal Means: access to meds What has been your use of drugs/alcohol within the last 12 months?: none Previous Attempts/Gestures: No How many times?: 0 Other Self Harm Risks: none Triggers for Past Attempts:  (n/a) Intentional Self Injurious Behavior: Cutting Comment - Self Injurious Behavior: pt reports she last cut herself 6 mos ago Family Suicide History: Yes (sister had suicide attempt when pt young) Recent stressful life event(s): Loss (Comment) (SI on and off since 20 yo, finally broke up with boyfriend) Persecutory voices/beliefs?: No Depression: Yes Depression Symptoms: Insomnia, Tearfulness, Isolating, Fatigue, Loss of interest in usual pleasures, Guilt, Feeling worthless/self pity (hypersomnia, poor appetite) Substance abuse history and/or treatment for substance abuse?: No Suicide prevention information given to non-admitted patients: Not applicable  Risk to Others within the past 6 months Homicidal Ideation: No Does patient have any lifetime risk of violence toward others beyond the six months prior to admission? : No Thoughts of Harm to Others: No Current Homicidal Intent: No Current Homicidal Plan: No Access to Homicidal Means: No Identified Victim: none History of harm to others?: No Assessment of Violence: None Noted Violent Behavior Description: pt denies hx violence - is calm Does patient have access to weapons?: No Criminal Charges Pending?: No Does patient have a court date: Yes Court Date: 12/08/15 (possible suspended license for not paying court fees traffic) Is patient on probation?:  No  Psychosis Hallucinations: None noted Delusions: None noted  Mental Status Report Appearance/Hygiene: Unremarkable, Other (Comment) (superficial cut on L index finger from chopping at work ) [employer]: Good Motor Activity: Freedom of movement Speech: Logical/coherent Level of Consciousness: Alert Mood: Depressed, Sad, Anhedonia Affect: Appropriate to circumstance, Depressed, Sad (crying at times) Anxiety Level: None Thought Processes: Coherent, Relevant Judgement: Unimpaired Orientation: Person, Place, Situation, Time Obsessive Compulsive Thoughts/Behaviors: None  Cognitive Functioning Concentration: Decreased Memory: Recent Intact, Remote Intact IQ: Average Insight: Good Impulse Control: Fair Appetite: Poor Weight Loss:  (pt sts lost weight after parents' 2011 divorce & never gaine) Sleep: Increased Total Hours of Sleep: 10 Vegetative Symptoms: None  ADLScreening [facility] Assessment Services) Patient's cognitive ability adequate to safely complete daily activities?: Yes Patient able to express need for assistance with ADLs?: Yes Independently performs ADLs?: Yes (appropriate for developmental age)  Prior Inpatient Therapy Prior Inpatient Therapy: No Prior Therapy Dates: na Prior Therapy Facilty/Provider(s): na Reason for Treatment: na  Prior Outpatient Therapy Prior Outpatient Therapy: Yes Prior Therapy Dates: when pt 13 or 14 yio Prior Therapy Facilty/Provider(s): unknown Reason for Treatment: pt was having conflict w/ mom Does patient have an ACCT team?: No Does patient have Intensive In-House Services?  : No Does patient have [facility] services? : No Does patient have P4CC services?: No  ADL Screening (  condition at time of admission) Patient's cognitive ability adequate to safely complete daily activities?: Yes Is the patient deaf or have difficulty hearing?: No Does the patient have difficulty seeing, even when wearing glasses/contacts?: No Does the  patient have difficulty concentrating, remembering, or making decisions?: Yes Patient able to express need for assistance with ADLs?: Yes Does the patient have difficulty dressing or bathing?: No Independently performs ADLs?: Yes (appropriate for developmental age) Does the patient have difficulty walking or climbing stairs?: No Weakness of Legs: None Weakness of Arms/Hands: None  Home Assistive Devices/Equipment Home Assistive Devices/Equipment: None    Abuse/Neglect Assessment (Assessment to be complete while patient is alone) Physical Abuse: Denies Verbal Abuse: Denies Sexual Abuse: Denies Exploitation of patient/patient's resources: Denies Self-Neglect: Denies     Merchant navy officer (For Healthcare) Does patient have an advance directive?: No Would patient like information on creating an advanced directive?: No - patient declined information    Additional Information 1:1 In Past 12 Months?: No CIRT Risk: No Elopement Risk: No Does patient have medical clearance?: No     Disposition:  Disposition Initial Assessment Completed for this Encounter: Yes Disposition of Patient: Inpatient treatment program Type of inpatient treatment program: Adult (laura davis NP accepts to [id number])  Giamarie Bueche P 11/17/2015 5:02 PM

## 2015-11-17 NOTE — Progress Notes (Signed)
Adult Psychoeducational Group Note  Date:  11/17/2015 Time:  8:54 PM  Group Topic/Focus:  Wrap-Up Group:   The focus of this group is to help patients review their daily goal of treatment and discuss progress on daily workbooks.  Participation Level:  Active  Participation Quality:  Attentive  Affect:  Appropriate  Cognitive:  Oriented  Insight: Good  Engagement in Group:  Engaged  Modes of Intervention:  Activity  Additional Comments:  Patient rated her day a 7. Goal is to go home and stay as positive as she can while being here.  Kathleen Durham 11/17/2015, 8:54 PM

## 2015-11-18 DIAGNOSIS — F4323 Adjustment disorder with mixed anxiety and depressed mood: Principal | ICD-10-CM

## 2015-11-18 LAB — COMPREHENSIVE METABOLIC PANEL
ALK PHOS: 44 U/L (ref 38–126)
ALT: 12 U/L — AB (ref 14–54)
ANION GAP: 8 (ref 5–15)
AST: 16 U/L (ref 15–41)
Albumin: 4.6 g/dL (ref 3.5–5.0)
BILIRUBIN TOTAL: 0.6 mg/dL (ref 0.3–1.2)
BUN: 9 mg/dL (ref 6–20)
CALCIUM: 9.2 mg/dL (ref 8.9–10.3)
CO2: 26 mmol/L (ref 22–32)
CREATININE: 0.66 mg/dL (ref 0.44–1.00)
Chloride: 106 mmol/L (ref 101–111)
Glucose, Bld: 121 mg/dL — ABNORMAL HIGH (ref 65–99)
Potassium: 3.5 mmol/L (ref 3.5–5.1)
SODIUM: 140 mmol/L (ref 135–145)
TOTAL PROTEIN: 7.4 g/dL (ref 6.5–8.1)

## 2015-11-18 LAB — CBC
HEMATOCRIT: 36.8 % (ref 36.0–46.0)
HEMOGLOBIN: 12.4 g/dL (ref 12.0–15.0)
MCH: 29 pg (ref 26.0–34.0)
MCHC: 33.7 g/dL (ref 30.0–36.0)
MCV: 86.2 fL (ref 78.0–100.0)
Platelets: 237 10*3/uL (ref 150–400)
RBC: 4.27 MIL/uL (ref 3.87–5.11)
RDW: 12.4 % (ref 11.5–15.5)
WBC: 7.3 10*3/uL (ref 4.0–10.5)

## 2015-11-18 LAB — RAPID URINE DRUG SCREEN, HOSP PERFORMED
Amphetamines: NOT DETECTED
Barbiturates: NOT DETECTED
Benzodiazepines: NOT DETECTED
COCAINE: NOT DETECTED
OPIATES: NOT DETECTED
TETRAHYDROCANNABINOL: POSITIVE — AB

## 2015-11-18 LAB — PREGNANCY, URINE: PREG TEST UR: NEGATIVE

## 2015-11-18 LAB — TSH: TSH: 0.666 u[IU]/mL (ref 0.350–4.500)

## 2015-11-18 NOTE — H&P (Signed)
Psychiatric Admission Assessment Adult  Patient Identification: Lenisha Lacap MRN:  [id number] Date of Evaluation:  11/18/2015 Chief Complaint:  MDD Principal Diagnosis: Depression Diagnosis:   Patient Active Problem List   Diagnosis Date Noted  . Depression [F32.9] 11/17/2015    Priority: High   History of Present Illness: Zailyn Bloodworth-Ross is an 20 y.o. female. presents voluntarily to [facility] for walk in assessment. She is cooperative and oriented x 4.  Patient stated that she was just overwhelmed and needed to "organize her life."  She states that she drover herself to [facility] in the hopes of getting connected with a therapist to talk to as she did not always feel open to talking to her parents.  Patient is deferring on taking meds and feels that she just needed to talk to a professional.  She also reports that [facility] gave her an ultimatum and that if she did not sign papers to be admitted, that they would "commit me."  Per medical records, patient reportedly endorsed SI, texted her mom today and told mom that she was [place] take a lot of ibuprofen after work" in suicide attempt.  She was seen today and she spoke pressured speech.  Per  Nursing, she had just broken up with boyfriend.  She denied history of depression and she forwarded little about her boyfriend, only to read in chart, that they had broken up just before she came in to [facility].  Pt denies substance use. She denies hx of abuse. Pt sts she lives w/ her dad, dad's gf, and pt's 58 yo sister lives there occasionally. 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 She reports she stopped school for the semester because she couldn't get herself to class.   Patient states that she does not need any medications, "I don't need any right now."  Associated Signs/Symptoms: Depression Symptoms:  anxiety, panic attacks, (Hypo) Manic Symptoms:  Labiality of Mood, Anxiety Symptoms:  Excessive Worry, Psychotic Symptoms:  NA PTSD Symptoms: NA Total Time  spent with patient: 45 minutes  Past Psychiatric History: see HPI  Is the patient at risk to self? No.  Has the patient been a risk to self in the past 6 months? No.  Has the patient been a risk to self within the distant past? No.  Is the patient a risk to others? No.  Has the patient been a risk to others in the past 6 months? No.  Has the patient been a risk to others within the distant past? No.   Prior Inpatient Therapy: Prior Inpatient Therapy: No Prior Therapy Dates: na Prior Therapy Facilty/Provider(s): na Reason for Treatment: na Prior Outpatient Therapy: Prior Outpatient Therapy: Yes Prior Therapy Dates: when pt 13 or 14 yio Prior Therapy Facilty/Provider(s): unknown Reason for Treatment: pt was having conflict w/ mom Does patient have an ACCT team?: No Does patient have Intensive In-House Services?  : No Does patient have [facility] services? : No Does patient have P4CC services?: No  Alcohol Screening: 1. How often do you have a drink containing alcohol?: Never 9. Have you or someone else been injured as a result of your drinking?: No 10. Has a relative or friend or a doctor or another health worker been concerned about your drinking or suggested you cut down?: No Alcohol Use Disorder Identification Test Final Score (AUDIT): 0 Brief Intervention: AUDIT score less than 7 or less-screening does not suggest unhealthy drinking-brief intervention not indicated Substance Abuse History in the last 12 months:  No. Consequences  of Substance Abuse: NA Previous Psychotropic Medications: No  Psychological Evaluations: Yes  Past Medical History:  Past Medical History  Diagnosis Date  . Medical history non-contributory   . Anxiety     Past Surgical History  Procedure Laterality Date  . No past surgeries     Family History: History reviewed. No pertinent family history. Family Psychiatric  History: see HPI Tobacco Screening: [phone]::1)@ Social History:  History   Alcohol Use No     History  Drug Use No    Additional Social History: Marital status: Single    Pain Medications: pt denies abuse - see pta meds list Prescriptions: pt denies abuse - see pta meds list  Over the Counter: pt denies abuse - see pta meds list History of alcohol / drug use?: No history of alcohol / drug abuse Longest period of sobriety (when/how long): n/a    Allergies:  No Known Allergies Lab Results: No results found for this or any previous visit (from the past 48 hour(s)).  Blood Alcohol level:  Lab Results  Component Value Date   [employer]  07/02/2008    <5        LOWEST DETECTABLE LIMIT FOR SERUM ALCOHOL IS 5 mg/dL FOR MEDICAL PURPOSES ONLY    Metabolic Disorder Labs:  No results found for: HGBA1C, MPG No results found for: PROLACTIN No results found for: CHOL, TRIG, HDL, CHOLHDL, VLDL, LDLCALC  Current Medications: Current Facility-Administered Medications  Medication Dose Route Frequency Provider Last Rate Last Dose  . acetaminophen (TYLENOL) tablet 650 mg  650 mg Oral Q6H PRN Thermon Leyland, NP      . alum & mag hydroxide-simeth (MAALOX/MYLANTA) 200-200-20 MG/5ML suspension 30 mL  30 mL Oral Q4H PRN Thermon Leyland, NP      . hydrOXYzine (ATARAX/VISTARIL) tablet 25 mg  25 mg Oral Q6H PRN Thermon Leyland, NP      . magnesium hydroxide (MILK OF MAGNESIA) suspension 30 mL  30 mL Oral Daily PRN Thermon Leyland, NP      . traZODone (DESYREL) tablet 50 mg  50 mg Oral QHS PRN Thermon Leyland, NP       PTA Medications: Prescriptions prior to admission  Medication Sig Dispense Refill Last Dose  . ibuprofen (ADVIL,MOTRIN) 200 MG tablet Take 400-600 mg by mouth every 6 (six) hours as needed (For cramping.).   Past Week  . doxycycline (VIBRAMYCIN) 100 MG capsule Take 1 capsule (100 mg total) by mouth 2 (two) times daily. (Patient not taking: Reported on 11/18/2015) 28 capsule 0 Completed Course  . ibuprofen (ADVIL,MOTRIN) 800 MG tablet Take 1 tablet (800 mg total) by mouth  every 8 (eight) hours as needed. (Patient not taking: Reported on 11/18/2015) 21 tablet 0 Completed Course  . traMADol (ULTRAM) 50 MG tablet Take 1 tablet (50 mg total) by mouth every 6 (six) hours as needed. (Patient not taking: Reported on 11/18/2015) 15 tablet 0 Completed Course    Musculoskeletal: Strength & Muscle Tone: within normal limits Gait & Station: normal Patient leans: N/A  Psychiatric Specialty Exam: Physical Exam  ROS  Blood pressure 136/75, pulse 99, temperature 98.6 F (37 C), temperature source Oral, resp. rate 18, height  (1.549 m), weight 49.896 kg (110 lb), last menstrual period 12/07/2014, SpO2 100 %, unknown if currently breastfeeding.Body mass index is 20.8 kg/(m^2).  General Appearance: Casual  Eye Contact:  Fair  Speech:  Clear and Coherent  Volume:  Normal  Mood:  Anxious and Dysphoric  Affect:  Full Range  Thought Process:  Coherent  Orientation:  Full (Time, Place, and Person)  Thought Content:  WDL  Suicidal Thoughts:  No  Homicidal Thoughts:  No  Memory:  Immediate;   Fair Recent;   Fair Remote;   Fair  Judgement:  Impaired  Insight:  Fair  Psychomotor Activity:  Normal  Concentration:  Concentration: Fair and Attention Span: Fair  Recall:  [employer]  Fund of Knowledge:  Fair  Language:  Good  Akathisia:  Negative  Handed:  Right  AIMS (if indicated):     Assets:  Communication Skills Resilience Social Support  ADL's:  Intact  Cognition:  WNL  Sleep:  Number of Hours: 5.25   Treatment Plan Summary: Admit for crisis management and mood stabilization. Medication management to re-stabilize current mood symptoms Group counseling sessions for coping skills Medical consults as needed Review and reinstate any pertinent home medications for other health problems  Observation Level/Precautions:  15 minute checks  Laboratory:  per ED  Psychotherapy:    Medications:  Trazodone 50 mg insomnia, defers on any other psychotropics  Consultations:   As needed  Discharge Concerns:  safety  Estimated LOS: 2-4 days  Other:     I certify that inpatient services furnished can reasonably be expected to improve the patient's condition.    Lindwood QuaSheila May Daequan Kozma, NP [facility] 6/21/20172:20 PM

## 2015-11-18 NOTE — Progress Notes (Signed)
Patient ID: Kathleen Durham, female   DOB: Oct 26, 1995, 20 y.o.   MRN: [id number]  DAR: Pt. Denies SI/HI and A/V Hallucinations. She reports sleep is good, appetite is good, energy level is high, and concentration is good. She rates depression, anxiety, and hopelessness 0/10. She reports she is ready to leave today. Patient appears to be minimizing as well. Stating she is feeling okay and is just ready to go. Patient is not taking any scheduled medications at this time. Patient does not report any pain or discomfort at this time. Support and encouragement provided to the patient. Patient is minimal but cooperative. She appears to stay to herself on the unit but is pleasant upon interaction. Q15 minute checks are maintained for safety.

## 2015-11-18 NOTE — Progress Notes (Signed)
Recreation Therapy Notes  Date: 06.21.2017 Time: 9:30am Location: [address] Dayroom   Group Topic: Stress Management  Goal Area(s) Addresses:  Patient will actively participate in stress management techniques presented during session.   Behavioral Response: Did not attend.   Damiana Berrian L Divina Neale, LRT/CTRS         Kathleen Durham L 11/18/2015 1:20 PM 

## 2015-11-18 NOTE — [facility] Suicide Risk Assessment (Signed)
[facility]   Nursing information obtained from:  Patient Demographic factors:  Adolescent or young adult Current Mental Status:  NA Loss Factors:  Legal issues Engineer, agricultural(Parking tickets) Historical Factors:  NA Risk Reduction Factors:  Living with another person, especially a relative  Total Time spent with patient: 30 minutes Principal Problem: <principal problem not specified> Diagnosis:   Patient Active Problem List   Diagnosis Date Noted  . Depression [F32.9] 11/17/2015   Subjective Data: Patient is a 20 year old American female who reports she became very overwhelmed and having suicidal thoughts.  Continued Clinical Symptoms:  Alcohol Use Disorder Identification Test Final Score (AUDIT): 0 The "Alcohol Use Disorders Identification Test", Guidelines for Use in Primary Care, Second Edition.  World Science writerHealth Organization [facility](WHO). Score between 0-7:  no or low risk or alcohol related problems. Score between 8-15:  moderate risk of alcohol related problems. Score between 16-19:  high risk of alcohol related problems. Score 20 or above:  warrants further diagnostic evaluation for alcohol dependence and treatment.   CLINICAL FACTORS:   Depression:   Hopelessness Impulsivity Insomnia Severe   Musculoskeletal: Strength & Muscle Tone: within normal limits Gait & Station: normal Patient leans: N/A  Psychiatric Specialty Exam: Physical Exam  ROS  Blood pressure 136/75, pulse 99, temperature 98.6 F (37 C), temperature source Oral, resp. rate 18, height 5\' 1"  (1.549 m), weight 110 lb (49.896 kg), last menstrual period 12/07/2014, SpO2 100 %, unknown if currently breastfeeding.Body mass index is 20.8 kg/(m^2).  General Appearance: Casual  Eye Contact:  Fair  Speech:  Clear and Coherent  Volume:  Normal  Mood:  Anxious and Dysphoric  Affect:  Constricted  Thought Process:  Coherent  Orientation:  Full (Time, Place, and Person)  Thought Content:  WDL  Suicidal  Thoughts:  No  Homicidal Thoughts:  No  Memory:  Immediate;   Fair Recent;   Fair Remote;   Fair  Judgement:  Impaired  Insight:  Shallow  Psychomotor Activity:  Normal  Concentration:  Concentration: Fair and Attention Span: Fair  Recall:  [employer]  Fund of Knowledge:  Fair  Language:  Fair  Akathisia:  No  Handed:  Right  AIMS (if indicated):     Assets:  Communication Skills Desire for Improvement  ADL's:  Intact  Cognition:  WNL  Sleep:  Number of Hours: 5.25      COGNITIVE FEATURES THAT CONTRIBUTE TO RISK:  Thought constriction (tunnel vision)    SUICIDE RISK:   Moderate:  Frequent suicidal ideation with limited intensity, and duration, some specificity in terms of plans, no associated intent, good self-control, limited dysphoria/symptomatology, some risk factors present, and identifiable protective factors, including available and accessible social support.  PLAN OF CARE:   Continue to monitor patient for safety and mood. Provide supportive counseling. Address any medical issues as they arise. Obtain collateral history from family as needed. Medications as appropriate to address mood.  I certify that inpatient services furnished can reasonably be expected to improve the patient's condition.   Patrick NorthAVI, [place] Doig, MD 11/18/2015, 12:49 PM

## 2015-11-18 NOTE — [facility] Group Notes (Signed)
   [facility]  11/18/2015  8:45 AM   Participation Quality: Alert, Appropriate and Oriented  Mood/Affect: Blunted  Depression Rating: 0  Anxiety Rating: 0  Thoughts of Suicide: Pt denies SI/HI  Will you contract for safety? Yes  Current AVH: Pt denies  Plan for Discharge/Comments: Pt attended discharge planning group and actively participated in group. CSW provided pt with today's workbook. Patient plans to return home to follow up with outpatient services.   Transportation Means: Pt reports access to transportation  Supports: No supports mentioned at this time  Samuella BruinKristin Marques Ericson, MSW, Johnson & JohnsonLCSW Clinical Social Worker [facility] [phone]

## 2015-11-18 NOTE — Tx Team (Signed)
Interdisciplinary Treatment Plan Update (Adult) Date: 11/18/2015    Time Reviewed: 9:30 AM  Progress in Treatment: Attending groups: Continuing to assess, patient new to milieu Participating in groups: Continuing to assess, patient new to milieu Taking medication as prescribed: Yes Tolerating medication: Yes Family/Significant other contact made: No, CSW assessing for appropriate contacts Patient understands diagnosis: Yes Discussing patient identified problems/goals with staff: Yes Medical problems stabilized or resolved: Yes Denies suicidal/homicidal ideation: Yes Issues/concerns per patient self-inventory: Yes Other:  New problem(s) identified: N/A  Discharge Plan or Barriers: Patient plans to return home and would like a referral for outpatient services.   Reason for Continuation of Hospitalization:  Depression Anxiety Medication Stabilization   Comments: N/A  Estimated length of stay: 2-3 days    Patient is a 20 year old female who presented to the hospital with depression and SI with plan to overdose on ibuprofen. Pt reports primary trigger(s) for admission was recent break up and school stressors . Patient will benefit from crisis stabilization, medication evaluation, group therapy and psycho education in addition to case management for discharge planning. At discharge, it is recommended that Pt remain compliant with established discharge plan and continued treatment.   Review of initial/current patient goals per problem list:  1. Goal(s): Patient will participate in aftercare plan   Met: Yes   Target date: 3-5 days post admission date   As evidenced by: Patient will participate within aftercare plan AEB aftercare provider and housing plan at discharge being identified.  6/21: Goal met. Patient plans to return home with outpatient services.    2. Goal (s): Patient will exhibit decreased depressive symptoms and suicidal ideations.   Met: Yes   Target  date: 3-5 days post admission date   As evidenced by: Patient will utilize self rating of depression at 3 or below and demonstrate decreased signs of depression or be deemed stable for discharge by MD.  6/21: Goal met. Patient rates depression at 0, denies SI.    3. Goal(s): Patient will demonstrate decreased signs and symptoms of anxiety.   Met: Yes    Target date: 3-5 days post admission date   As evidenced by: Patient will utilize self rating of anxiety at 3 or below and demonstrated decreased signs of anxiety, or be deemed stable for discharge by MD  6/21: Goal met. Patient rates anxiety at 0, denies SI.      Attendees: Patient:    Family:    Physician: Dr. Einar Grad; Dr. Shea Evans 11/18/2015 9:30 AM  Nursing: Mayra Neer, [address] Christa Vilma Meckel [place], RN 11/18/2015 9:30 AM  Clinical Social Worker: Tilden Fossa, LCSW 11/18/2015 9:30 AM  Other: Peri Maris, LCSWA; [place], LCSW  11/18/2015 9:30 AM  Other: 11/18/2015 9:30 AM  Other: Lars Pinks, Case Manager 11/18/2015 9:30 AM  Other: Agustina Caroli, May Augustin, NP 11/18/2015 9:30 AM  Other:    Other:      Scribe for Treatment Team:  Tilden Fossa, [place]

## 2015-11-19 ENCOUNTER — Encounter (HOSPITAL_COMMUNITY): Payer: Self-pay | Admitting: Psychiatry

## 2015-11-19 DIAGNOSIS — F4323 Adjustment disorder with mixed anxiety and depressed mood: Secondary | ICD-10-CM | POA: Clinically undetermined

## 2015-11-19 MED ORDER — TRAZODONE HCL 50 MG PO TABS: 50.0000 mg | ORAL_TABLET | Freq: Every evening | ORAL | Status: DC | PRN

## 2015-11-19 MED ORDER — TRAZODONE HCL 50 MG PO TABS: 50.0000 mg | ORAL_TABLET | Freq: Every evening | ORAL | Status: AC | PRN

## 2015-11-19 NOTE — Progress Notes (Addendum)
Patient ID: Kathleen Durham, female   DOB: 07/31/1995, 20 y.o.   MRN: [id number]   Pt currently presents with a flat affect and superficial behavior. Per self inventory, pt rates depression at a 0, hopelessness 0 and anxiety 1. Pt's daily goal is to "just continuing to remain a positive attitude and keep uplifting myself and taking care of myself mentally " and they intend to do so by "write everything im feeling, push any negative feelings to the back of my mind and just continue on." Pt reports fair sleep, good appetite, high energy and good concentration. Pt minimalizing symptoms of depression.   Pt provided with medications per providers orders. Pt's labs and vitals were monitored throughout the day. Pt supported emotionally and encouraged to express concerns and questions. Pt educated on medications and alternative relaxation techniques.   Pt's safety ensured with 15 minute and environmental checks. Pt currently denies SI/HI and A/V hallucinations. Pt verbally agrees to seek staff if SI/HI or A/VH occurs and to consult with staff before acting on any harmful thoughts. 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 And could I speak w/ May." Pt to be discharged today per MD. Will continue POC.

## 2015-11-19 NOTE — Progress Notes (Signed)
  [facility] :  Will you be returning to the same living situation after discharge:  Yes,  Pt returning home with family At discharge, do you have transportation home?: Yes,  Pt family to pick up Do you have the ability to pay for your medications: Yes,  Pt provided with prescriptions  Release of information consent forms completed and in the chart;  Patient's signature needed at discharge.  Patient to Follow up at: Follow-up Information    Follow up with [facility].   Why:  CSW made your initial referral to this practice. They require you to call to schedule the appointment. They know you will be calling; ask to speak with Tori.   Contact information:   [address], [place], [place], [address] [phone]  Office [phone]  Fax      Next level of care provider has access to [facility] Link:no  Safety Planning and Suicide Prevention discussed: Yes,  with mother; see SPE note  Have you used any form of tobacco in the last 30 days? (Cigarettes, Smokeless Tobacco, Cigars, and/or Pipes): No  Has patient been referred to the Quitline?: N/A patient is not a smoker  Patient has been referred for addiction treatment: N/A  Kathleen HoopsCarter, Kathleen Durham 11/19/2015, 1:06 PM

## 2015-11-19 NOTE — [facility] Group Notes (Signed)
Late Entry from 6/21:  [facility] LCSW Group Therapy 11/18/15  1:15 pm   Type of Therapy: Group Therapy Participation Level: Active  Participation Quality: Attentive, Sharing and Supportive  Affect: Appropriate  Cognitive: Alert and Oriented  Insight: Developing/Improving and Engaged  Engagement in Therapy: Developing/Improving and Engaged  Modes of Intervention: Clarification, Confrontation, Discussion, Education, Exploration, Limit-setting, Orientation, Problem-solving, Rapport Building, Dance movement psychotherapisteality Testing, Socialization and Support  Summary of Progress/Problems: The topic for group was balance in life. Today's group focused on defining balance in one's own words, identifying things that can knock one off balance, and exploring healthy ways to maintain balance in life. Group members were asked to provide an example of a time when they felt off balance, describe how they handled that situation,and process healthier ways to regain balance in the future. Group members were asked to share the most important tool for maintaining balance that they learned while at [facility] and how they plan to apply this method after discharge. Patient discussed her experience of feeling overwhelmed by responsibilities and financial obligations while trying to balance a social life. She identifies prioritizing as helpful to her.   Samuella BruinKristin Rana Adorno, MSW, LCSW Clinical Social Worker [facility] [phone]

## 2015-11-19 NOTE — Progress Notes (Signed)
D: When asked about her day pt stated, "I'm ready to go home". Stated, "there's nothing wrong with me. I don't need to be here". Pt stated she was informed that there's a chance she get to be discharged tomorrow. Pt has no questions or concerns.   A:  Support and encouragement was offered. 15 min checks continued for safety.  R: Pt remains safe.

## 2015-11-19 NOTE — Progress Notes (Signed)
Adult Psychoeducational Group Note  Date:  11/19/2015 Time:  12:52 AM  Group Topic/Focus:  Wrap-Up Group:   The focus of this group is to help patients review their daily goal of treatment and discuss progress on daily workbooks.  Participation Level:  Active  Participation Quality:  Appropriate  Affect:  Appropriate  Cognitive:  Appropriate  Insight: Good  Engagement in Group:  Engaged  Modes of Intervention:  Activity  Additional Comments:  Patient rated her day a 8. Goal is to get ready to go home and maintain a positive relationship with others.  Natasha MeadKiara M Ephriam Turman 11/19/2015, 12:52 AM

## 2015-11-19 NOTE — Discharge Summary (Signed)
Physician Discharge Summary Note  Patient:  Kathleen Durham is an 20 y.o., female MRN:  [id number] DOB:  Oct 04, 1995 Patient phone:  [phone] (home)  Patient address:   [address] [place] [address],  Total Time spent with patient: 30 minutes  Date of Admission:  11/17/2015 Date of Discharge: 11/19/2015  Reason for Admission:  Suicidal ideation  Principal Problem: Adjustment disorder with mixed anxiety and depressed mood Discharge Diagnoses: Patient Active Problem List   Diagnosis Date Noted  . Adjustment disorder with mixed anxiety and depressed mood [F43.23] 11/19/2015   Past Psychiatric History:  See HPI  Past Medical History:  Past Medical History  Diagnosis Date  . Medical history non-contributory   . Anxiety     Past Surgical History  Procedure Laterality Date  . No past surgeries     Family History: History reviewed. No pertinent family history. Family Psychiatric  History: see HPI Social History:  History  Alcohol Use No     History  Drug Use No    Social History   Social History  . Marital Status: Single    Spouse Name: N/A  . Number of Children: N/A  . Years of Education: N/A   Social History Main Topics  . Smoking status: Never Smoker   . Smokeless tobacco: None  . Alcohol Use: No  . Drug Use: No  . Sexual Activity: Yes    Birth Control/ Protection: None   Other Topics Concern  . None   Social History Narrative    Hospital Course:  Kathleen Durham, 20 yo [facility], under the impression that she could be connected with an outpatient provider.  Collateral information provided by patient's mother revealed that patient had called her and endorsed SI.    Kathleen Durham was admitted for Adjustment disorder with mixed anxiety and depressed mood and crisis management.  She was offered Trazodone 50 mg to help with sleep and antidepressant, patient deferred.  She adamantly asserts that she does not need  psychotropics at this time and she really just wants to be able to get established with a therapist.  Medical problems were identified and treated as needed.  Home medications were restarted as appropriate.  Improvement was monitored by observation and Kathleen Durham daily report of symptom reduction.  Emotional and mental status was monitored by daily self inventory reports completed by Kathleen Durham and clinical staff.  Patient reported continued improvement, denied any new concerns.  Patient had been compliant on medications and denied side effects.  Support and encouragement was provided.    At time of discharge, patient rated both depression and anxiety levels to be manageable and minimal.  Patient encouraged to attend groups to help with recognizing triggers of emotional crises and de-stabilizations.  Patient encouraged to attend group to help identify the positive things in life that would help in dealing with feelings of loss, depression and unhealthy or abusive tendencies.         Kathleen Durham was evaluated by the treatment team for stability and plans for continued recovery upon discharge.  She was offered further treatment options upon discharge including Residential, Intensive Outpatient and Outpatient treatment.  She will follow up with agencies listed below for medication management and counseling.  Encouraged patient to maintain satisfactory support network and home environment.  Advised to adhere to medication compliance and outpatient treatment follow up.  Prescriptions provided.       Kathleen Durham motivation was an integral  factor for scheduling further treatment.  Employment, transportation, bed availability, health status, family support, and any pending legal issues were also considered during her hospital stay.  Upon completion of this admission the patient was both mentally and medically stable for discharge denying suicidal/homicidal ideation,  auditory/visual/tactile hallucinations, delusional thoughts and paranoia.      Physical Findings: AIMS: Facial and Oral Movements Muscles of Facial Expression: None, normal Lips and Perioral Area: None, normal Jaw: None, normal Tongue: None, normal,Extremity Movements Upper (arms, wrists, hands, fingers): None, normal Lower (legs, knees, ankles, toes): None, normal, Trunk Movements Neck, shoulders, hips: None, normal, Overall Severity Severity of abnormal movements (highest score from questions above): None, normal Incapacitation due to abnormal movements: None, normal Patient's awareness of abnormal movements (rate only patient's report): No Awareness, Dental Status Current problems with teeth and/or dentures?: No Does patient usually wear dentures?: No  CIWA:    COWS:     Musculoskeletal: Strength & Muscle Tone: within normal limits Gait & Station: normal Patient leans: N/A  Psychiatric Specialty Exam:  SEE MD SRA Physical Exam  Vitals reviewed. Psychiatric: Her mood appears not anxious. She is not agitated, not aggressive, not hyperactive and not combative. Thought content is not paranoid. She does not exhibit a depressed mood. She expresses no homicidal and no suicidal ideation.    Review of Systems  Psychiatric/Behavioral: Negative for depression, suicidal ideas and substance abuse. The patient is not nervous/anxious.   All other systems reviewed and are negative.   Blood pressure 107/74, pulse 88, temperature 98.1 F (36.7 C), temperature source Oral, resp. rate 16, height  (1.549 m), weight 49.896 kg (110 lb), last menstrual period 12/07/2014, SpO2 100 %, unknown if currently breastfeeding.Body mass index is 20.8 kg/(m^2).   Have you used any form of tobacco in the last 30 days? (Cigarettes, Smokeless Tobacco, Cigars, and/or Pipes): No  Has this patient used any form of tobacco in the last 30 days? (Cigarettes, Smokeless Tobacco, Cigars, and/or Pipes) Yes, NA  Blood  Alcohol level:  Lab Results  Component Value Date   [facility]  07/02/2008    <5        LOWEST DETECTABLE LIMIT FOR SERUM ALCOHOL IS 5 mg/dL FOR MEDICAL PURPOSES ONLY    Metabolic Disorder Labs:  No results found for: HGBA1C, MPG No results found for: PROLACTIN No results found for: CHOL, TRIG, HDL, CHOLHDL, VLDL, LDLCALC  See Psychiatric Specialty Exam and Suicide Risk Assessment completed by Attending Physician prior to discharge.  Discharge destination:  Home  Is patient on multiple antipsychotic therapies at discharge:  No   Has Patient had three or more failed trials of antipsychotic monotherapy by history:  No  Recommended Plan for Multiple Antipsychotic Therapies: NA     Medication List    STOP taking these medications        anti-nausea solution     Doxylamine-Pyridoxine 10-10 MG Tbec  Commonly known as:  DICLEGIS     metroNIDAZOLE 500 MG tablet  Commonly known as:  FLAGYL     nitrofurantoin (macrocrystal-monohydrate) 100 MG capsule  Commonly known as:  MACROBID     PRENATAL COMPLETE 14-0.4 MG Tabs     promethazine 25 MG suppository  Commonly known as:  PHENERGAN     promethazine 25 MG tablet  Commonly known as:  PHENERGAN      TAKE these medications      Indication   doxycycline 100 MG capsule  Commonly known as:  VIBRAMYCIN  Take 1 capsule (  100 mg total) by mouth 2 (two) times daily.      ibuprofen 800 MG tablet  Commonly known as:  ADVIL,MOTRIN  Take 1 tablet (800 mg total) by mouth every 8 (eight) hours as needed.      traMADol 50 MG tablet  Commonly known as:  ULTRAM  Take 1 tablet (50 mg total) by mouth every 6 (six) hours as needed.      traZODone 50 MG tablet  Commonly known as:  DESYREL  Take 1 tablet (50 mg total) by mouth at bedtime as needed for sleep.   Indication:  Trouble Sleeping        Follow-up recommendations:  Activity:  as tol Diet:  as tol  Comments:  1.  Take all your medications as prescribed.   2.  Report any  adverse side effects to outpatient provider. 3.  Patient instructed to not use alcohol or illegal drugs while on prescription medicines. 4.  In the event of worsening symptoms, instructed patient to call 911, the crisis hotline or go to nearest emergency room for evaluation of symptoms.  Signed: Lindwood QuaSheila May Imani Fiebelkorn, NP [facility] 11/19/2015, 12:55 PM

## 2015-11-19 NOTE — Discharge Summary (Deleted)
Physician Discharge Summary Note  Patient:  Kathleen Durham is an 20 y.o., female MRN:  [id number] DOB:  1996/02/10 Patient phone:  [phone] (home)  Patient address:   [address] [place] [address],  Total Time spent with patient: 30 minutes  Date of Admission:  11/17/2015 Date of Discharge: 11/20/2015  Reason for Admission:  Depression, verbalized SI  Principal Problem: Adjustment disorder with mixed anxiety and depressed mood Discharge Diagnoses: Patient Active Problem List   Diagnosis Date Noted  . Adjustment disorder with mixed anxiety and depressed mood [F43.23] 11/19/2015    Past Psychiatric History:  see HPI  Past Medical History:  Past Medical History  Diagnosis Date  . Medical history non-contributory   . Anxiety     Past Surgical History  Procedure Laterality Date  . No past surgeries     Family History: History reviewed. No pertinent family history. Family Psychiatric  History:  see HPI Social History:  History  Alcohol Use No     History  Drug Use No    Social History   Social History  . Marital Status: Single    Spouse Name: N/A  . Number of Children: N/A  . Years of Education: N/A   Social History Main Topics  . Smoking status: Never Smoker   . Smokeless tobacco: None  . Alcohol Use: No  . Drug Use: No  . Sexual Activity: Yes    Birth Control/ Protection: None   Other Topics Concern  . None   Social History Narrative    Hospital Course:    Kathleen Durham was admitted for Adjustment disorder with mixed anxiety and depressed mood and crisis management.  She was was offered medication interventions but deferred.  Medical problems were identified and treated as needed.  Home medications were restarted as appropriate.  Improvement was monitored by observation and Kathleen Durham daily report of symptom reduction.  Emotional and mental status was monitored by daily self inventory reports completed by Kathleen Limbo  Durham and clinical staff.  Patient reported continued improvement, denied any new concerns.  Patient had been compliant on medications and denied side effects.  Support and encouragement was provided.    At time of discharge, patient rated both depression and anxiety levels to be manageable and minimal.  Patient encouraged to attend groups to help with recognizing triggers of emotional crises and de-stabilizations.  Patient encouraged to attend group to help identify the positive things in life that would help in dealing with feelings of loss, depression and unhealthy or abusive tendencies.         Kathleen Durham was evaluated by the treatment team for stability and plans for continued recovery upon discharge.  She was offered further treatment options upon discharge including Residential, Intensive Outpatient and Outpatient treatment. She will follow up with agencies listed below for medication management and counseling.  Encouraged patient to maintain satisfactory support network and home environment.  Advised to adhere to medication compliance and outpatient treatment follow up.  Prescriptions provided.       Kathleen Durham motivation was an integral factor for scheduling further treatment.  Employment, transportation, bed availability, health status, family support, and any pending legal issues were also considered during her hospital stay.  Upon completion of this admission the patient was both mentally and medically stable for discharge denying suicidal/homicidal ideation, auditory/visual/tactile hallucinations, delusional thoughts and paranoia.      Physical Findings: AIMS: Facial and Oral Movements Muscles of Facial Expression: None, normal Lips and Perioral  Area: None, normal Jaw: None, normal Tongue: None, normal,Extremity Movements Upper (arms, wrists, hands, fingers): None, normal Lower (legs, knees, ankles, toes): None, normal, Trunk Movements Neck, shoulders,  hips: None, normal, Overall Severity Severity of abnormal movements (highest score from questions above): None, normal Incapacitation due to abnormal movements: None, normal Patient's awareness of abnormal movements (rate only patient's report): No Awareness, Dental Status Current problems with teeth and/or dentures?: No Does patient usually wear dentures?: No  CIWA:    COWS:     Musculoskeletal: Strength & Muscle Tone: within normal limits Gait & Station: normal Patient leans: N/A  Psychiatric Specialty Exam:  See MD SRA Physical Exam  Nursing note and vitals reviewed.   ROS  Blood pressure 107/74, pulse 88, temperature 98.1 F (36.7 C), temperature source Oral, resp. rate 16, height 5\' 1"  (1.549 m), weight 49.896 kg (110 lb), last menstrual period 12/07/2014, SpO2 100 %, unknown if currently breastfeeding.Body mass index is 20.8 kg/(m^2).   Have you used any form of tobacco in the last 30 days? (Cigarettes, Smokeless Tobacco, Cigars, and/or Pipes): No  Has this patient used any form of tobacco in the last 30 days? (Cigarettes, Smokeless Tobacco, Cigars, and/or Pipes)  Na  Blood Alcohol level:  Lab Results  Component Value Date   [facility]  07[facility]    <5        LOWEST DETECTABLE LIMIT FOR SERUM ALCOHOL IS 5 mg/dL FOR MEDICAL PURPOSES ONLY    Metabolic Disorder Labs:  No results found for: HGBA1C, MPG No results found for: PROLACTIN No results found for: CHOL, TRIG, HDL, CHOLHDL, VLDL, LDLCALC  See Psychiatric Specialty Exam and Suicide Risk Assessment completed by Attending Physician prior to discharge.  Discharge destination:  Home  Is patient on multiple antipsychotic therapies at discharge:  No   Has Patient had three or more failed trials of antipsychotic monotherapy by history:  No  Recommended Plan for Multiple Antipsychotic Therapies: NA     Medication List    STOP taking these medications        anti-nausea solution     Doxylamine-Pyridoxine 10-10 MG  Tbec  Commonly known as:  DICLEGIS     metroNIDAZOLE 500 MG tablet  Commonly known as:  FLAGYL     nitrofurantoin (macrocrystal-monohydrate) 100 MG capsule  Commonly known as:  MACROBID     PRENATAL COMPLETE 14-0.4 MG Tabs     promethazine 25 MG suppository  Commonly known as:  PHENERGAN     promethazine 25 MG tablet  Commonly known as:  PHENERGAN     traMADol 50 MG tablet  Commonly known as:  ULTRAM      TAKE these medications      Indication   doxycycline 100 MG capsule  Commonly known as:  VIBRAMYCIN  Take 1 capsule (100 mg total) by mouth 2 (two) times daily.      ibuprofen 800 MG tablet  Commonly known as:  ADVIL,MOTRIN  Take 1 tablet (800 mg total) by mouth every 8 (eight) hours as needed.      traZODone 50 MG tablet  Commonly known as:  DESYREL  Take 1 tablet (50 mg total) by mouth at bedtime as needed for sleep.   Indication:  Trouble Sleeping       Follow-up Information    Follow up with [facility].   Why:  CSW made your initial referral to this practice. They require you to call to schedule the appointment. They know you will be  calling; ask to speak with Tori.   Contact information:   [address], [place], [place], [id number] [phone]  Office [phone]  Fax      Follow-up recommendations:  Activity:  as tol Diet:  as tol  Comments:  1.  Take all your medications as prescribed.   2.  Report any adverse side effects to outpatient provider. 3.  Patient instructed to not use alcohol or illegal drugs while on prescription medicines. 4.  In the event of worsening symptoms, instructed patient to call 911, the crisis hotline or go to nearest emergency room for evaluation of symptoms.  Signed: Lindwood QuaSheila May Ruthanne Mcneish, NP [facility] 11/20/2015, 2:39 PM

## 2015-11-19 NOTE — [facility] Suicide Risk Assessment (Signed)
[facility]   Principal Problem: Adjustment disorder with mixed anxiety and depressed mood Discharge Diagnoses:  Patient Active Problem List   Diagnosis Date Noted  . Adjustment disorder with mixed anxiety and depressed mood [F43.23] 11/19/2015    Total Time spent with patient: 30 minutes  Musculoskeletal: Strength & Muscle Tone: within normal limits Gait & Station: normal Patient leans: N/A  Psychiatric Specialty Exam: Review of Systems  Psychiatric/Behavioral: Negative for depression, suicidal ideas and hallucinations.  All other systems reviewed and are negative.   Blood pressure 107/74, pulse 88, temperature 98.1 F (36.7 C), temperature source Oral, resp. rate 16, height 5\' 1"  (1.549 m), weight 49.896 kg (110 lb), last menstrual period 12/07/2014, SpO2 100 %, unknown if currently breastfeeding.Body mass index is 20.8 kg/(m^2).  General Appearance: Guarded  Eye Contact::  Fair  Speech:  Normal Rate409  Volume:  Normal  Mood:  Euthymic  Affect:  Congruent  Thought Process:  Goal Directed and Descriptions of Associations: Intact  Orientation:  Full (Time, Place, and Person)  Thought Content:  Logical  Suicidal Thoughts:  No  Homicidal Thoughts:  No  Memory:  Immediate;   Fair Recent;   Fair Remote;   Fair  Judgement:  Fair  Insight:  Fair  Psychomotor Activity:  Normal  Concentration:  Fair  Recall:  FiservFair  Fund of Knowledge:Fair  Language: Fair  Akathisia:  No  Handed:  Right  AIMS (if indicated):     Assets:  Communication Skills Desire for Improvement  Sleep:  Number of Hours: 6.5  Cognition: WNL  ADL's:  Intact   Mental Status Per Nursing Assessment::   On Admission:  NA  Demographic Factors:  Low socioeconomic status  Loss Factors: Financial problems/change in socioeconomic status  Historical Factors: Impulsivity  Risk Reduction Factors:   Positive social support  Continued Clinical Symptoms:  Alcohol/Substance  Abuse/Dependencies  Cognitive Features That Contribute To Risk:  None    Suicide Risk:  Minimal: No identifiable suicidal ideation.  Patients presenting with no risk factors but with morbid ruminations; may be classified as minimal risk based on the severity of the depressive symptoms    Plan Of Care/Follow-up recommendations:  Activity:  NO RESTRICTIONS Diet:  REGULAR Tests:  AS NEEDED Other:  [facility] WITH [facility]  Tempestt Silba, MD 11/19/2015, 12:11 PM

## 2015-11-19 NOTE — BHH Counselor (Signed)
Adult Comprehensive Assessment  Patient ID: Gwenyth AllegraKiara Bloodworth-Ross, female   DOB: 11-01-1995, 20 y.o.   MRN: [id number]  Information Source: Information source: Patient  Current Stressors:  Educational / Learning stressors: Plans to return to [employer] in August to complete degree in social work Employment / Job issues: Works as a Orthoptistmanager of a pizza restaurant  Family Relationships: Reports strong level of support from family Financial / Lack of resources (include bankruptcy): Has financial stressors Housing / Lack of housing: [place] Physical health (include injuries & life threatening diseases): Denies Social relationships: Denies Substance abuse: Denies  Bereavement / Loss: Denies  Living/Environment/Situation:  Living Arrangements: Parent, Other relatives Living conditions (as described by patient or guardian): Lives with father, father's girlfriend, and sister in [place] What is atmosphere in current home: Comfortable  Family History:  Marital status: Long term relationship Long term relationship, how long?: 1.5 yrs What types of issues is patient dealing with in the relationship?: Reports having a recent argument with boyfriend prior to admission but identifies him as supportive Does patient have children?: No  Childhood History:  By whom was/is the patient raised?: Both parents Additional childhood history information: Parents separated when she was 20 y.o. Description of patient's relationship with caregiver when they were a child: Close with both but was always closest with her father. Reports that relationship with mother improved when she moved in with mother after parents separation Patient's description of current relationship with people who raised him/her: Close with both parents Does patient have siblings?: Yes Number of Siblings: 5 Description of patient's current relationship with siblings: Reports a good relationship with her siblings but is  particularly close with her sister that she lives with  Did patient suffer any verbal/emotional/physical/sexual abuse as a child?: No Did patient suffer from severe childhood neglect?: No Has patient ever been sexually abused/assaulted/raped as an adolescent or adult?: No Was the patient ever a victim of a crime or a disaster?: No Witnessed domestic violence?: No Has patient been effected by domestic violence as an adult?: No  Education:  Highest grade of school patient has completed: Plans to start her senior year of college in August Currently a student?: No Name of school: [employer] Learning disability?: No  Employment/Work Situation:   Employment situation: Employed Where is patient currently employed?: Works as a Orthoptistmanager of a pizza restaurant  How long has patient been employed?: Oct. 2016 Patient's job has been impacted by current illness: No What is the longest time patient has a held a job?: 4 yrs Where was the patient employed at that time?: [employer] Has patient ever been in the Eli Lilly and Companymilitary?: No  Financial Resources:   Surveyor, quantityinancial resources: Income from employment Does patient have a representative payee or guardian?: No  Alcohol/Substance Abuse:   What has been your use of drugs/alcohol within the last 12 months?: Denies Alcohol/Substance Abuse Treatment Hx: Denies past history Has alcohol/substance abuse ever caused legal problems?: No  Social Support System:   Conservation officer, natureatient's Community Support System: Good Describe Community Support System: Family support, boyfriend, has a close friend Type of faith/religion: Did not specify How does patient's faith help to cope with current illness?: Finds prayer helpful  Leisure/Recreation:   Leisure and Hobbies: drawing, Clinical cytogeneticistcrafting, writing poetry, trying to learn to play the guitar  Strengths/Needs:   What things does the patient do well?: helping people, good listener In what areas does patient struggle / problems for patient: getting  overwhelmed by finances and responsibilities, comparing herself  to others  Discharge Plan:   Does patient have access to transportation?: Yes Will patient be returning to same living situation after discharge?: Yes Currently receiving community mental health services: No If no, would patient like referral for services when discharged?: Yes (What county?) ([place] ) Does patient have financial barriers related to discharge medications?: No  Summary/Recommendations:     Patient is a 20 year old female who presented to the hospital with depression and per TTS assessment- SI with plan to overdose on ibuprofen. Pt reports primary trigger(s) for admission was feeling overwhelmed with finances and responsibilities. Patient reports that she was unaware that she would be hospitalized when she came in for assessment and was just wanting outpatient referrals. She plans to return home with her family in [place]. Patient will benefit from crisis stabilization, medication evaluation, group therapy and psycho education in addition to case management for discharge planning. At discharge, it is recommended that Pt remain compliant with established discharge plan and continued treatment.  Zach Tietje, [place] CarboKristin L. 11/19/2015

## 2015-11-19 NOTE — Tx Team (Signed)
Interdisciplinary Treatment Plan Update (Adult) Date: 11/19/2015    Time Reviewed: 9:30 AM  Progress in Treatment: Attending groups: Yes Participating in groups: Yes Taking medication as prescribed: Yes Tolerating medication: Yes Family/Significant other contact made: Yes with mother Patient understands diagnosis: Yes Discussing patient identified problems/goals with staff: Yes Medical problems stabilized or resolved: Yes Denies suicidal/homicidal ideation: Yes Issues/concerns per patient self-inventory: Yes Other:  New problem(s) identified: N/A  Discharge Plan or Barriers: Patient plans to return home and would like a referral for outpatient services.   Reason for Continuation of Hospitalization:  Depression Anxiety Medication Stabilization   Comments: N/A  Estimated length of stay: 0 days    Patient is a 20 year old female who presented to the hospital with depression and SI with plan to overdose on ibuprofen. Pt reports primary trigger(s) for admission was recent break up and school stressors . Patient will benefit from crisis stabilization, medication evaluation, group therapy and psycho education in addition to case management for discharge planning. At discharge, it is recommended that Pt remain compliant with established discharge plan and continued treatment.   Review of initial/current patient goals per problem list:  1. Goal(s): Patient will participate in aftercare plan   Met: Yes   Target date: 3-5 days post admission date   As evidenced by: Patient will participate within aftercare plan AEB aftercare provider and housing plan at discharge being identified.  6/21: Goal met. Patient plans to return home with outpatient services.    2. Goal (s): Patient will exhibit decreased depressive symptoms and suicidal ideations.   Met: Yes   Target date: 3-5 days post admission date   As evidenced by: Patient will utilize self rating of depression at 3 or  below and demonstrate decreased signs of depression or be deemed stable for discharge by MD.  6/21: Goal met. Patient rates depression at 0, denies SI.    3. Goal(s): Patient will demonstrate decreased signs and symptoms of anxiety.   Met: Yes    Target date: 3-5 days post admission date   As evidenced by: Patient will utilize self rating of anxiety at 3 or below and demonstrated decreased signs of anxiety, or be deemed stable for discharge by MD  6/21: Goal met. Patient rates anxiety at 0, denies SI.      Attendees: Patient:    Family:    Physician: Dr. Einar Grad; Dr. Shea Evans 11/18/2015 9:30 AM  Nursing: Marcella Dubs, RN 11/18/2015 9:30 AM  Clinical Social Worker: Erasmo Downer Drinkard, LCSW 11/18/2015 9:30 AM  Other: Peri Maris, LCSWA; [place], LCSW  11/18/2015 9:30 AM  Other: 11/18/2015 9:30 AM  Other: Lars Pinks, Case Manager 11/18/2015 9:30 AM  Other: Agustina Caroli, May Augustin, NP 11/18/2015 9:30 AM  Other:    Other:      Scribe for Treatment Team:  Peri Maris, [place] Work [phone]

## 2015-11-19 NOTE — Progress Notes (Signed)
Patient ID: Kathleen Durham, female   DOB: 1996-01-11, 20 y.o.   MRN: [id number] Adult Psychoeducational Group Note  Date:  11/19/2015 Time: 09:45am   Group Topic/Focus:  Self Care:   The focus of this group is to help patients understand the importance of self-care in order to improve or restore emotional, physical, spiritual, interpersonal, and financial health.  Participation Level:  Active  Participation Quality:  Appropriate  Affect:  Appropriate  Cognitive:  Appropriate  Insight: Improving  Engagement in Group:  Engaged  Modes of Intervention:  Activity, Discussion, Education and Support  Additional Comments:  Pt attended group, verbalized that she plans to spend more time with family.   Aurora Maskwyman, [place] E 11/19/2015, 11:09 AM

## 2015-11-19 NOTE — Progress Notes (Signed)
Patient ID: Kathleen Durham, female   DOB: 12-Aug-1995, 20 y.o.   MRN: [id number] Pt discharged to lobby. Pt was stable and appreciative at that time. All papers and prescriptions were given and valuables returned. Verbal understanding expressed. Denies SI/HI and A/VH. Pt given opportunity to express concerns and ask questions.

## 2015-11-19 NOTE — [facility] Suicide Risk Assessment (Signed)
[facility] INPATIENT:  Family/Significant Other Suicide Prevention Education  Suicide Prevention Education:  Education Completed; mother Latina Tenny CrawRoss [phone],  (name of family member/significant other) has been identified by the patient as the family member/significant other with whom the patient will be residing, and identified as the person(s) who will aid the patient in the event of a mental health crisis (suicidal ideations/suicide attempt).  With written consent from the patient, the family member/significant other has been provided the following suicide prevention education, prior to the and/or following the discharge of the patient.  The suicide prevention education provided includes the following:  Suicide risk factors  Suicide prevention and interventions  National Suicide Hotline telephone number  [facility] assessment telephone number  [facility] Emergency Assistance 911  [facility] and/or Residential Mobile Crisis Unit telephone number  Request made of family/significant other to:  Remove weapons (e.g., guns, rifles, knives), all items previously/currently identified as safety concern.    Remove drugs/medications (over-the-counter, prescriptions, illicit drugs), all items previously/currently identified as a safety concern.  The family member/significant other verbalizes understanding of the suicide prevention education information provided.  The family member/significant other agrees to remove the items of safety concern listed above.  Keri Tavella, [place] Car[place]ristin L 11/19/2015, 11:15 AM

## 2015-12-29 ENCOUNTER — Encounter (HOSPITAL_COMMUNITY): Payer: Self-pay | Admitting: Emergency Medicine

## 2015-12-29 ENCOUNTER — Ambulatory Visit (HOSPITAL_COMMUNITY)
Admission: EM | Admit: 2015-12-29 | Discharge: 2015-12-29 | Disposition: A | Discharge status: Home / Self Care | Payer: Federal, State, Local not specified - PPO | Attending: Family Medicine | Admitting: Family Medicine

## 2015-12-29 DIAGNOSIS — B3731 Acute candidiasis of vulva and vagina: Secondary | ICD-10-CM

## 2015-12-29 DIAGNOSIS — R102 Pelvic and perineal pain: Secondary | ICD-10-CM | POA: Diagnosis present

## 2015-12-29 DIAGNOSIS — B373 Candidiasis of vulva and vagina: Secondary | ICD-10-CM | POA: Insufficient documentation

## 2015-12-29 LAB — POCT URINALYSIS DIP (DEVICE)
Bilirubin Urine: NEGATIVE
GLUCOSE, UA: NEGATIVE mg/dL
Ketones, ur: NEGATIVE mg/dL
Nitrite: NEGATIVE
Protein, ur: NEGATIVE mg/dL
SPECIFIC GRAVITY, URINE: 1.02 (ref 1.005–1.030)
UROBILINOGEN UA: 1 mg/dL (ref 0.0–1.0)
pH: 8.5 — ABNORMAL HIGH (ref 5.0–8.0)

## 2015-12-29 LAB — POCT PREGNANCY, URINE: Preg Test, Ur: NEGATIVE

## 2015-12-29 MED ORDER — FLUCONAZOLE 200 MG PO TABS: 200.0000 mg | ORAL_TABLET | Freq: Every day | ORAL | 0 refills | Status: AC

## 2015-12-29 NOTE — ED Provider Notes (Signed)
CSN: [id number]     Arrival date & time 12/29/15  1317 History   First MD Initiated Contact with Patient 12/29/15 1428     Chief Complaint  Patient presents with  . Vaginal Pain   (Consider location/radiation/quality/duration/timing/severity/associated sxs/prior Treatment) HPI 20 Y/O FEMALE WITH 1 WEEK HX OF THICK WHITE VAGINAL D/C AND VAGINAL ITCHING. LAST SEXUAL INTERCOURSE WAS THIS PAST WEEKEND. DOES NOT USE CONDOMS WITH BOYFRIEND. HISTORY OF CHLAMYDIA. VAGINA PAIN IS WORSE WHEN SITTING. DENIES ANY NEW SOAPS OR CREAMS TO THE AREA. Past Medical History:  Diagnosis Date  . Anxiety   . Medical history non-contributory    Past Surgical History:  Procedure Laterality Date  . NO PAST SURGERIES     History reviewed. No pertinent family history. Social History  Substance Use Topics  . Smoking status: Never Smoker  . Smokeless tobacco: Never Used  . Alcohol use No   OB History    Gravida Para Term Preterm AB Living   1         0   SAB TAB Ectopic Multiple Live Births                 Review of Systems  Denies: HEADACHE, NAUSEA, ABDOMINAL PAIN, CHEST PAIN, CONGESTION, DYSURIA, SHORTNESS OF BREATH  Allergies  Review of patient's allergies indicates no known allergies.  Home Medications   Prior to Admission medications   Medication Sig Start Date End Date Taking? Authorizing Provider  traZODone (DESYREL) 50 MG tablet Take 1 tablet (50 mg total) by mouth at bedtime as needed for sleep. 11/19/15  Yes Adonis Brook, NP  doxycycline (VIBRAMYCIN) 100 MG capsule Take 1 capsule (100 mg total) by mouth 2 (two) times daily. Patient not taking: Reported on 11/18/2015 08/15/15   Rolm Gala Barrett, PA-C  fluconazole (DIFLUCAN) 200 MG tablet Take 1 tablet (200 mg total) by mouth daily. 12/29/15 01/01/16  Tharon Aquas, PA  ibuprofen (ADVIL,MOTRIN) 800 MG tablet Take 1 tablet (800 mg total) by mouth every 8 (eight) hours as needed. Patient not taking: Reported on 11/18/2015 09/08/15   Charlestine Night, PA-C   Meds Ordered and Administered this Visit  Medications - No data to display  BP 112/69 (BP Location: Left Arm)   Pulse 84   Temp 98.8 F (37.1 C) (Oral)   Resp 12   LMP 12/14/2015 (Exact Date)   SpO2 100%   Breastfeeding? No  No data found.   Physical Exam NURSES NOTES AND VITAL SIGNS REVIEWED. CONSTITUTIONAL: Well developed, well nourished, no acute distress HEENT: normocephalic, atraumatic EYES: Conjunctiva normal NECK:normal ROM, supple, no adenopathy PULMONARY:No respiratory distress, normal effort ABDOMINAL: Soft, ND, NT BS+, No CVAT MUSCULOSKELETAL: Normal ROM of all extremities,  SKIN: warm and dry without rash PSYCHIATRIC: Mood and affect, behavior are normal NURSES NOTES AND VITAL SIGNS REVIEWED. CONSTITUTIONAL: Well developed, well nourished, no acute distress HEENT: normocephalic, atraumatic, right and left TM's are normal EYES: Conjunctiva normal NECK:normal ROM, supple, no adenopathy PULMONARY:No respiratory distress, normal effort Exam is performed with patient's permission Female nursing staff present to chaperone.  Perineum: clean, dry without lesions, no groin or inguinal Lymphadenopathy; urethra . No caruncle or prolapse noted.  Vaginal canal: moderate amount thick white adherent discharge noted in canal with loss of rugae. LARGE amount thin homogenous white-yellow discharge in fornix.  Cervix is pink and non-friable.      Urgent Care Course   Clinical Course    Procedures (including critical care time)  Labs Review Labs Reviewed  POCT URINALYSIS DIP (DEVICE) - Abnormal; Notable for the following:       Result Value   Hgb urine dipstick TRACE (*)    pH 8.5 (*)    Leukocytes, UA LARGE (*)    All other components within normal limits  POCT PREGNANCY, URINE  CERVICOVAGINAL ANCILLARY ONLY   DISCUSSED WITH PATIENT PRIOR TO DISCHARGE. CULTURES ARE PENDING.  Imaging Review No results found.   Visual Acuity Review  Right Eye  Distance:   Left Eye Distance:   Bilateral Distance:    Right Eye Near:   Left Eye Near:    Bilateral Near:        RX FOR DIFLUCAN IS PROVIDED.  MDM   1. Vaginal yeast infection     Patient is reassured that there are no issues that require transfer to higher level of care at this time or additional tests. Patient is advised to continue home symptomatic treatment. Patient is advised that if there are new or worsening symptoms to attend the emergency department, contact primary care provider, or return to UC. Instructions of care provided discharged home in stable condition.    THIS NOTE WAS GENERATED USING A VOICE RECOGNITION SOFTWARE PROGRAM. ALL REASONABLE EFFORTS  WERE MADE TO PROOFREAD THIS DOCUMENT FOR ACCURACY.  I have verbally reviewed the discharge instructions with the patient. A printed AVS was given to the patient.  All questions were answered prior to discharge.      Tharon Aquas, PA 12/29/15 [phone]

## 2015-12-29 NOTE — ED Triage Notes (Signed)
The patient presented to the [facility] with a complaint of vaginal pain x 1 week. The patient denied any discharge or dysuria. The patient requested STD screening.

## 2015-12-30 LAB — CERVICOVAGINAL ANCILLARY ONLY
CHLAMYDIA, DNA PROBE: NEGATIVE
NEISSERIA GONORRHEA: NEGATIVE
Wet Prep (BD Affirm): POSITIVE — AB

## 2015-12-30 LAB — URINE CULTURE: Culture: 20000 — AB

## 2016-01-02 ENCOUNTER — Telehealth (HOSPITAL_COMMUNITY): Payer: Self-pay | Admitting: Emergency Medicine

## 2016-01-02 NOTE — Telephone Encounter (Signed)
No voicemail established at number on chart.  Unable to leave a message of any kind.

## 2016-01-02 NOTE — Telephone Encounter (Signed)
-----   Message from Eustace Moore, MD sent at 12/31/2015  1:28 PM EDT ----- Please let patient know that test for candida (yeast) was positive.  Rx fluconazole was given at [facility] visit 12/29/15.   Urine culture does not meet criteria for UTI, and no urinary symptoms (dysuria) were recorded in visit note.   Recheck or followup PCP, Noelle Redmon, if symptoms persist.  LM

## 2016-01-05 NOTE — Progress Notes (Signed)
Verified identity.  Patient notified of results, patient did get prescription filled and patient has no other complaints.

## 2016-04-12 ENCOUNTER — Emergency Department (HOSPITAL_COMMUNITY)
Admission: EM | Admit: 2016-04-12 | Discharge: 2016-04-12 | Disposition: A | Discharge status: Home / Self Care | Payer: Federal, State, Local not specified - PPO | Attending: Emergency Medicine | Admitting: Emergency Medicine

## 2016-04-12 ENCOUNTER — Encounter (HOSPITAL_COMMUNITY): Payer: Self-pay

## 2016-04-12 DIAGNOSIS — R102 Pelvic and perineal pain: Secondary | ICD-10-CM | POA: Insufficient documentation

## 2016-04-12 LAB — CBC WITH DIFFERENTIAL/PLATELET
BASOS PCT: 1 %
Basophils Absolute: 0 10*3/uL (ref 0.0–0.1)
EOS ABS: 0.2 10*3/uL (ref 0.0–0.7)
Eosinophils Relative: 2 %
HEMATOCRIT: 39 % (ref 36.0–46.0)
Hemoglobin: 13.1 g/dL (ref 12.0–15.0)
Lymphocytes Relative: 29 %
Lymphs Abs: 2.2 10*3/uL (ref 0.7–4.0)
MCH: 29.2 pg (ref 26.0–34.0)
MCHC: 33.6 g/dL (ref 30.0–36.0)
MCV: 87.1 fL (ref 78.0–100.0)
MONO ABS: 0.3 10*3/uL (ref 0.1–1.0)
MONOS PCT: 4 %
NEUTROS ABS: 4.8 10*3/uL (ref 1.7–7.7)
Neutrophils Relative %: 64 %
Platelets: 313 10*3/uL (ref 150–400)
RBC: 4.48 MIL/uL (ref 3.87–5.11)
RDW: 12.2 % (ref 11.5–15.5)
WBC: 7.4 10*3/uL (ref 4.0–10.5)

## 2016-04-12 LAB — URINALYSIS, ROUTINE W REFLEX MICROSCOPIC
Bilirubin Urine: NEGATIVE
GLUCOSE, UA: NEGATIVE mg/dL
HGB URINE DIPSTICK: NEGATIVE
KETONES UR: NEGATIVE mg/dL
LEUKOCYTES UA: NEGATIVE
Nitrite: NEGATIVE
PROTEIN: NEGATIVE mg/dL
Specific Gravity, Urine: 1.014 (ref 1.005–1.030)
pH: 6.5 (ref 5.0–8.0)

## 2016-04-12 LAB — COMPREHENSIVE METABOLIC PANEL
ALBUMIN: 4.1 g/dL (ref 3.5–5.0)
ALT: 22 U/L (ref 14–54)
ANION GAP: 8 (ref 5–15)
AST: 22 U/L (ref 15–41)
Alkaline Phosphatase: 57 U/L (ref 38–126)
BILIRUBIN TOTAL: 0.5 mg/dL (ref 0.3–1.2)
BUN: 9 mg/dL (ref 6–20)
CO2: 25 mmol/L (ref 22–32)
Calcium: 9.6 mg/dL (ref 8.9–10.3)
Chloride: 106 mmol/L (ref 101–111)
Creatinine, Ser: 0.71 mg/dL (ref 0.44–1.00)
GFR calc non Af Amer: >60 mL/min (ref >60)
GLUCOSE: 108 mg/dL — AB (ref 65–99)
POTASSIUM: 4.1 mmol/L (ref 3.5–5.1)
Sodium: 139 mmol/L (ref 135–145)
TOTAL PROTEIN: 6.9 g/dL (ref 6.5–8.1)

## 2016-04-12 LAB — WET PREP, GENITAL
CLUE CELLS WET PREP: NONE SEEN
Sperm: NONE SEEN
Trich, Wet Prep: NONE SEEN
YEAST WET PREP: NONE SEEN

## 2016-04-12 LAB — PREGNANCY, URINE: PREG TEST UR: NEGATIVE

## 2016-04-12 MED ORDER — DOXYCYCLINE HYCLATE 100 MG PO CAPS: 100.0000 mg | ORAL_CAPSULE | Freq: Two times a day (BID) | ORAL | 0 refills | Status: AC

## 2016-04-12 MED ORDER — CEFTRIAXONE SODIUM 250 MG IJ SOLR
250.0000 mg | Freq: Once | INTRAMUSCULAR | Status: AC
  Administered 2016-04-12: 250 mg via INTRAMUSCULAR
  Filled 2016-04-12: qty 250

## 2016-04-12 MED ORDER — LIDOCAINE HCL (PF) 1 % IJ SOLN
2.0000 mL | Freq: Once | INTRAMUSCULAR | Status: AC
  Administered 2016-04-12: 1 mL
  Filled 2016-04-12: qty 5

## 2016-04-12 NOTE — ED Triage Notes (Signed)
Patient complains of lower pelvic pain intermittent since august, states that she thinks she has PID again, no other complaints

## 2016-04-12 NOTE — ED Provider Notes (Signed)
[facility]-EMERGENCY DEPT Provider Note   CSN: [id number] Arrival date & time: 04/12/16  1026     History   Chief Complaint Chief Complaint  Patient presents with  . Pelvic Pain/PID    HPI Kathleen Durham is a 20 y.o. female.  Patient presents to the emergency department with chief complaint of pelvic pain. She reports having had intermittent pelvic pain for several months. She states that back in August she was treated for PID with doxycycline. She states that this resolves her pelvic pain. She is concerned that this has returned, and asks to be treated again. She states that she has been seen by her OB/GYN, and has had recent pelvic ultrasounds, but no cause the pain has been found. Denies any new sexual partners. She denies vaginal discharge, but does report dyspareunia. She denies any fevers, chills, or vomiting. Denies any dysuria or hematuria.   The history is provided by the patient. No language interpreter was used.    Past Medical History:  Diagnosis Date  . Anxiety   . Medical history non-contributory     Patient Active Problem List   Diagnosis Date Noted  . Adjustment disorder with mixed anxiety and depressed mood 11/19/2015    Past Surgical History:  Procedure Laterality Date  . NO PAST SURGERIES      OB History    Gravida Para Term Preterm AB Living   1         0   SAB TAB Ectopic Multiple Live Births                   Home Medications    Prior to Admission medications   Medication Sig Start Date End Date Taking? Authorizing Provider  acetaminophen (TYLENOL) 325 MG tablet Take 650 mg by mouth every 6 (six) hours as needed for mild pain.   Yes Historical Provider, MD  traZODone (DESYREL) 50 MG tablet Take 1 tablet (50 mg total) by mouth at bedtime as needed for sleep. 11/19/15  Yes Adonis BrookSheila Agustin, NP  doxycycline (VIBRAMYCIN) 100 MG capsule Take 1 capsule (100 mg total) by mouth 2 (two) times daily. 04/12/16   Roxy Horsemanobert Jaiven Graveline, PA-C  ibuprofen  (ADVIL,MOTRIN) 800 MG tablet Take 1 tablet (800 mg total) by mouth every 8 (eight) hours as needed. Patient not taking: Reported on 11/18/2015 09/08/15   Charlestine Nighthristopher Lawyer, PA-C    Family History No family history on file.  Social History Social History  Substance Use Topics  . Smoking status: Never Smoker  . Smokeless tobacco: Never Used  . Alcohol use No     Allergies   Patient has no known allergies.   Review of Systems Review of Systems  Genitourinary: Positive for pelvic pain.  All other systems reviewed and are negative.    Physical Exam Updated Vital Signs BP 120/81   Pulse 91   Temp 98.1 F (36.7 C) (Oral)   Resp 15   Ht 5\' 1"  (1.549 m)   Wt 49.9 kg   SpO2 100%   BMI 20.78 kg/m   Physical Exam  Constitutional: She is oriented to person, place, and time. She appears well-developed and well-nourished.  HENT:  Head: Normocephalic and atraumatic.  Eyes: Conjunctivae and EOM are normal. Pupils are equal, round, and reactive to light.  Neck: Normal range of motion. Neck supple.  Cardiovascular: Normal rate and regular rhythm.  Exam reveals no gallop and no friction rub.   No murmur heard. Pulmonary/Chest: Effort normal and breath sounds normal. No  respiratory distress. She has no wheezes. She has no rales. She exhibits no tenderness.  Abdominal: Soft. Bowel sounds are normal. She exhibits no distension and no mass. There is no tenderness. There is no rebound and no guarding.  Genitourinary:  Genitourinary Comments: Pelvic exam chaperoned by female ER tech, performed by PA student, under my direct supervision, no right adnexal tenderness, mild left adnexal tenderness, no uterine tenderness, moderate white vaginal discharge, no bleeding, no CMT or friability, no foreign body, no injury to the external genitalia, no other significant findings   Musculoskeletal: Normal range of motion. She exhibits no edema or tenderness.  Neurological: She is alert and oriented to  person, place, and time.  Skin: Skin is warm and dry.  Psychiatric: She has a normal mood and affect. Her behavior is normal. Judgment and thought content normal.  Nursing note and vitals reviewed.    ED Treatments / Results  Labs (all labs ordered are listed, but only abnormal results are displayed) Labs Reviewed  WET PREP, GENITAL - Abnormal; Notable for the following:       Result Value   WBC, Wet Prep HPF POC MANY (*)    All other components within normal limits  COMPREHENSIVE METABOLIC PANEL - Abnormal; Notable for the following:    Glucose, Bld 108 (*)    All other components within normal limits  URINALYSIS, ROUTINE W REFLEX MICROSCOPIC (NOT AT [facility])  CBC WITH DIFFERENTIAL/PLATELET  PREGNANCY, URINE  POC URINE PREG, ED  GC/CHLAMYDIA PROBE AMP ([place]) NOT AT [facility]    EKG  EKG Interpretation None       Radiology No results found.  Procedures Procedures (including critical care time)  Medications Ordered in ED Medications  cefTRIAXone (ROCEPHIN) injection 250 mg (250 mg Intramuscular Given 04/12/16 1343)  lidocaine (PF) (XYLOCAINE) 1 % injection 2 mL (1 mL Other Given 04/12/16 1345)     Initial Impression / Assessment and Plan / ED Course  I have reviewed the triage vital signs and the nursing notes.  Pertinent labs & imaging results that were available during my care of the patient were reviewed by me and considered in my medical decision making (see chart for details).  Clinical Course     Patient with intermittent pelvic pain times several months. She is concerned about PID. She reports history of the same. She states that this is similar. She reports having taken doxycycline in the past with good relief. She denies any fevers, chills, nausea, vomiting. Denies dysuria or hematuria. Wet prep is remarkable for many white blood cells. She does have a mild amount of left adnexal tenderness, but she refuses ultrasound for further evaluation this time. I  doubt abscess or torsion given the length of the symptoms. Consider ovarian cyst, however patient states she does not have a history of this. She ultimately refused the ultrasound, and states that she would like a new OB/GYN follow-up with. I will give her resources for this. I will also treat her with doxycycline, since resolved her symptoms several months ago.  Final Clinical Impressions(s) / ED Diagnoses   Final diagnoses:  Pelvic pain    New Prescriptions Current Discharge Medication List       Roxy HorsemanRobert Rickia [place], PA-C 04/12/16 1406    Loren Raceravid Yelverton, MD 04/13/16 1152

## 2016-04-13 LAB — GC/CHLAMYDIA PROBE AMP (~~LOC~~) NOT AT ARMC
CHLAMYDIA, DNA PROBE: NEGATIVE
NEISSERIA GONORRHEA: NEGATIVE
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
